# Patient Record
Sex: Male | Born: 1995 | Race: Black or African American | Hispanic: No | Marital: Single | Smoking: Current every day smoker
Health system: Southern US, Community
[De-identification: ages and names within clinical notes are randomized; demographics above are authoritative.]

## PROBLEM LIST (undated history)

## (undated) DIAGNOSIS — R011 Cardiac murmur, unspecified: Secondary | ICD-10-CM

## (undated) HISTORY — PX: CARDIAC SURGERY: SHX584

---

## 2016-07-19 ENCOUNTER — Emergency Department (HOSPITAL_COMMUNITY)
Admission: EM | Admit: 2016-07-19 | Discharge: 2016-07-19 | Disposition: A | Discharge status: Home / Self Care | Payer: Self-pay | Attending: Emergency Medicine | Admitting: Emergency Medicine

## 2016-07-19 ENCOUNTER — Encounter (HOSPITAL_COMMUNITY): Payer: Self-pay

## 2016-07-19 ENCOUNTER — Emergency Department (HOSPITAL_COMMUNITY): Payer: Self-pay

## 2016-07-19 DIAGNOSIS — R109 Unspecified abdominal pain: Secondary | ICD-10-CM

## 2016-07-19 DIAGNOSIS — E860 Dehydration: Secondary | ICD-10-CM

## 2016-07-19 DIAGNOSIS — K529 Noninfective gastroenteritis and colitis, unspecified: Secondary | ICD-10-CM | POA: Insufficient documentation

## 2016-07-19 DIAGNOSIS — R112 Nausea with vomiting, unspecified: Secondary | ICD-10-CM

## 2016-07-19 HISTORY — DX: Cardiac murmur, unspecified: R01.1

## 2016-07-19 LAB — CBC
HEMATOCRIT: 44.7 % (ref 39.0–52.0)
Hemoglobin: 15.4 g/dL (ref 13.0–17.0)
MCH: 32.7 pg (ref 26.0–34.0)
MCHC: 34.5 g/dL (ref 30.0–36.0)
MCV: 94.9 fL (ref 78.0–100.0)
PLATELETS: 305 10*3/uL (ref 150–400)
RBC: 4.71 MIL/uL (ref 4.22–5.81)
RDW: 13 % (ref 11.5–15.5)
WBC: 10.3 10*3/uL (ref 4.0–10.5)

## 2016-07-19 LAB — COMPREHENSIVE METABOLIC PANEL
ALT: 40 U/L (ref 17–63)
AST: 40 U/L (ref 15–41)
Albumin: 4.9 g/dL (ref 3.5–5.0)
Alkaline Phosphatase: 71 U/L (ref 38–126)
Anion gap: 10 (ref 5–15)
BUN: 18 mg/dL (ref 6–20)
CHLORIDE: 105 mmol/L (ref 101–111)
CO2: 23 mmol/L (ref 22–32)
CREATININE: 0.88 mg/dL (ref 0.61–1.24)
Calcium: 9.8 mg/dL (ref 8.9–10.3)
GFR calc Af Amer: >60 mL/min (ref >60)
Glucose, Bld: 77 mg/dL (ref 65–99)
Potassium: 3.8 mmol/L (ref 3.5–5.1)
SODIUM: 138 mmol/L (ref 135–145)
Total Bilirubin: 1.7 mg/dL — ABNORMAL HIGH (ref 0.3–1.2)
Total Protein: 8.5 g/dL — ABNORMAL HIGH (ref 6.5–8.1)

## 2016-07-19 LAB — URINE MICROSCOPIC-ADD ON

## 2016-07-19 LAB — URINALYSIS, ROUTINE W REFLEX MICROSCOPIC
GLUCOSE, UA: NEGATIVE mg/dL
Ketones, ur: >80 mg/dL — AB
LEUKOCYTES UA: NEGATIVE
Nitrite: NEGATIVE
PH: 6 (ref 5.0–8.0)
Protein, ur: 30 mg/dL — AB
Specific Gravity, Urine: 1.03 (ref 1.005–1.030)

## 2016-07-19 LAB — LIPASE, BLOOD

## 2016-07-19 MED ORDER — ONDANSETRON HCL 4 MG/2ML IJ SOLN
4.0000 mg | Freq: Once | INTRAMUSCULAR | Status: AC
  Administered 2016-07-19: 4 mg via INTRAVENOUS
  Filled 2016-07-19: qty 2

## 2016-07-19 MED ORDER — SODIUM CHLORIDE 0.9 % IV BOLUS (SEPSIS)
2000.0000 mL | Freq: Once | INTRAVENOUS | Status: AC
  Administered 2016-07-19: 2000 mL via INTRAVENOUS

## 2016-07-19 MED ORDER — PROMETHAZINE HCL 25 MG PO TABS: 25.0000 mg | ORAL_TABLET | Freq: Four times a day (QID) | ORAL | 0 refills | Status: DC | PRN

## 2016-07-19 NOTE — ED Notes (Signed)
Requested urine sample,patient states unable to provide one at this time.

## 2016-07-19 NOTE — ED Notes (Signed)
Patient states to weak for blood draw at this time.

## 2016-07-19 NOTE — ED Notes (Signed)
Pt educated that if he wants to leave prior to being seen that he needs to inform staff, so we can remove his IV.  Pt verbalized understanding.

## 2016-07-19 NOTE — ED Notes (Signed)
Patient denies nausea, given OJ per his request for PO challenge.

## 2016-07-19 NOTE — ED Provider Notes (Signed)
[facility]-EMERGENCY DEPT Provider Note   CSN: [id number] Arrival date & time: 07/19/16  1429  First Provider Contact:  First MD Initiated Contact with Patient 07/19/16 1940        History   Chief Complaint Chief Complaint  Patient presents with  . Abdominal Pain  . Emesis    HPI Wayne Short is a 20 y.o. male.  HPI Patient presents to the emergency department with abdominal pain with nausea and vomiting.  The patient states that he was out drinking last night, woke up this morning with nausea, vomiting and some abdominal discomfort.  Patient states that he having more flank pain than anything else.  Patient states that nothing seems make the condition better or worse.  Patient denies taking any medications prior to arrival.  The patient denies chest pain, shortness of breath, headache,blurred vision, neck pain, fever, cough, weakness, numbness, dizziness, anorexia, edema,  diarrhea, rash, back pain, dysuria, hematemesis, bloody stool, near syncope, or syncope. Past Medical History:  Diagnosis Date  . Heart murmur     There are no active problems to display for this patient.   Past Surgical History:  Procedure Laterality Date  . CARDIAC SURGERY         Home Medications    Prior to Admission medications   Not on File    Family History History reviewed. No pertinent family history.  Social History Social History  Substance Use Topics  . Smoking status: Never Smoker  . Smokeless tobacco: Never Used  . Alcohol use Yes     Allergies   Review of patient's allergies indicates no known allergies.   Review of Systems Review of Systems All other systems negative except as documented in the HPI. All pertinent positives and negatives as reviewed in the HPI. Physical Exam Updated Vital Signs BP 139/75 (BP Location: Left Arm)   Pulse 84   Temp 99 F (37.2 C) (Oral)   Resp 20   SpO2 98%   Physical Exam  Constitutional: He is oriented to person, place, and time.  He appears well-developed and well-nourished. No distress.  HENT:  Head: Normocephalic and atraumatic.  Mouth/Throat: Oropharynx is clear and moist.  Eyes: Pupils are equal, round, and reactive to light.  Neck: Normal range of motion. Neck supple.  Cardiovascular: Normal rate, regular rhythm and normal heart sounds.  Exam reveals no gallop and no friction rub.   No murmur heard. Pulmonary/Chest: Effort normal and breath sounds normal. No respiratory distress. He has no wheezes.  Abdominal: Soft. Bowel sounds are normal. He exhibits no distension and no mass. There is tenderness. There is no guarding.  Neurological: He is alert and oriented to person, place, and time. He exhibits normal muscle tone. Coordination normal.  Skin: Skin is warm and dry. No rash noted. No erythema.  Psychiatric: He has a normal mood and affect. His behavior is normal.  Nursing note and vitals reviewed.    ED Treatments / Results  Labs (all labs ordered are listed, but only abnormal results are displayed) Labs Reviewed  CBC  LIPASE, BLOOD  COMPREHENSIVE METABOLIC PANEL  URINALYSIS, ROUTINE W REFLEX MICROSCOPIC (NOT AT [facility])    EKG  EKG Interpretation None       Radiology No results found.  Procedures Procedures (including critical care time)  Medications Ordered in ED Medications - No data to display   Initial Impression / Assessment and Plan / ED Course  I have reviewed the triage vital signs and the nursing notes.  Pertinent labs & imaging results that were available during my care of the patient were reviewed by me and considered in my medical decision making (see chart for details).  Clinical Course    Patient retreated for alcohol and his nausea and vomiting.  Told to return here as needed.  CT scan was performed due to the fact that he is having significant flank pain on exam.  Patient is advised return here as needed.  He agrees the plan and all questions were answered.  The patient  has tolerated oral fluids  Final Clinical Impressions(s) / ED Diagnoses   Final diagnoses:  Flank pain    New Prescriptions New Prescriptions   No medications on file     Charlestine Night, PA-C 07/22/16 1518    Pricilla Loveless, MD 07/23/16 [phone]

## 2016-07-19 NOTE — ED Notes (Signed)
Patient c/o sudden onset emesis that started at 0800. Patient states he vomited so many times he couldn't count. Patient c/o left lateral abd pain, states this started after he was vomiting. Patient rates this pain at 1/10 now. Patient only "complaint" at this time is his throat is sore. Patient given water in effort to obtain UA, denies emesis. Patient with hypoactive bowel sounds x4 at this time. Patient is A&Ox4, NAD noted.   Patient is aware we are still in need of a urine sample. Second cup of water given by Molly Maduro, EMT.

## 2016-07-19 NOTE — ED Triage Notes (Signed)
Per EMS, Pt, from home, c/o L side abdominal pain and n/v starting this morning.  Pain score 10/10.  12.5G dextrose and 4mg  Zofran given en route.

## 2016-07-19 NOTE — Discharge Instructions (Signed)
Return here as needed. Slowly increase your fluid intake.  °

## 2016-09-14 ENCOUNTER — Encounter (HOSPITAL_COMMUNITY): Payer: Self-pay | Admitting: Vascular Surgery

## 2016-09-14 ENCOUNTER — Emergency Department (HOSPITAL_COMMUNITY)
Admission: EM | Admit: 2016-09-14 | Discharge: 2016-09-14 | Disposition: A | Discharge status: Home / Self Care | Payer: Self-pay | Attending: Emergency Medicine | Admitting: Emergency Medicine

## 2016-09-14 DIAGNOSIS — R112 Nausea with vomiting, unspecified: Secondary | ICD-10-CM | POA: Insufficient documentation

## 2016-09-14 DIAGNOSIS — Z5321 Procedure and treatment not carried out due to patient leaving prior to being seen by health care provider: Secondary | ICD-10-CM | POA: Insufficient documentation

## 2016-09-14 LAB — TROPONIN I

## 2016-09-14 LAB — COMPREHENSIVE METABOLIC PANEL
ALT: 25 U/L (ref 17–63)
AST: 34 U/L (ref 15–41)
Albumin: 4.7 g/dL (ref 3.5–5.0)
Alkaline Phosphatase: 70 U/L (ref 38–126)
Anion gap: 15 (ref 5–15)
BILIRUBIN TOTAL: 1.3 mg/dL — AB (ref 0.3–1.2)
BUN: 15 mg/dL (ref 6–20)
CO2: 19 mmol/L — ABNORMAL LOW (ref 22–32)
Calcium: 10.2 mg/dL (ref 8.9–10.3)
Chloride: 105 mmol/L (ref 101–111)
Creatinine, Ser: 1.02 mg/dL (ref 0.61–1.24)
Glucose, Bld: 92 mg/dL (ref 65–99)
POTASSIUM: 3.6 mmol/L (ref 3.5–5.1)
Sodium: 139 mmol/L (ref 135–145)
TOTAL PROTEIN: 8.8 g/dL — AB (ref 6.5–8.1)

## 2016-09-14 LAB — CBC
HCT: 43.1 % (ref 39.0–52.0)
Hemoglobin: 14.9 g/dL (ref 13.0–17.0)
MCH: 31.6 pg (ref 26.0–34.0)
MCHC: 34.6 g/dL (ref 30.0–36.0)
MCV: 91.5 fL (ref 78.0–100.0)
Platelets: 272 10*3/uL (ref 150–400)
RBC: 4.71 MIL/uL (ref 4.22–5.81)
RDW: 12.5 % (ref 11.5–15.5)
WBC: 8.2 10*3/uL (ref 4.0–10.5)

## 2016-09-14 MED ORDER — ONDANSETRON 4 MG PO TBDP
4.0000 mg | ORAL_TABLET | Freq: Once | ORAL | Status: AC
  Administered 2016-09-14: 4 mg via ORAL

## 2016-09-14 MED ORDER — ONDANSETRON 4 MG PO TBDP
ORAL_TABLET | ORAL | Status: AC
  Filled 2016-09-14: qty 1

## 2016-09-14 NOTE — ED Notes (Signed)
Pt sitting up -- no further vomiting.

## 2016-09-14 NOTE — ED Notes (Signed)
Patient left without being seen.

## 2016-09-14 NOTE — ED Triage Notes (Signed)
Pt reports to the ED for eval of N/V. Pt reports he drank ETOH yesterday and he smoked some weed as well and this am when he woke up he has been having N/V. Pt denies any symptoms other than the N/V. Pt requires repeated verbal stimuli to stay awake. Pt oriented x 4. [facility]. Pt reports some black emesis and some BRB streaking in the emesis. Pt also reports some abd pain and HA.

## 2017-01-16 ENCOUNTER — Encounter (HOSPITAL_COMMUNITY): Payer: Self-pay | Admitting: Emergency Medicine

## 2017-01-16 ENCOUNTER — Emergency Department (HOSPITAL_COMMUNITY)
Admission: EM | Admit: 2017-01-16 | Discharge: 2017-01-16 | Disposition: A | Discharge status: Home / Self Care | Payer: 59 | Attending: Emergency Medicine | Admitting: Emergency Medicine

## 2017-01-16 DIAGNOSIS — K920 Hematemesis: Secondary | ICD-10-CM | POA: Insufficient documentation

## 2017-01-16 LAB — COMPREHENSIVE METABOLIC PANEL
ALBUMIN: 4.4 g/dL (ref 3.5–5.0)
ALT: 27 U/L (ref 17–63)
AST: 28 U/L (ref 15–41)
Alkaline Phosphatase: 64 U/L (ref 38–126)
Anion gap: 11 (ref 5–15)
BUN: 11 mg/dL (ref 6–20)
CALCIUM: 9.5 mg/dL (ref 8.9–10.3)
CO2: 23 mmol/L (ref 22–32)
CREATININE: 0.75 mg/dL (ref 0.61–1.24)
Chloride: 102 mmol/L (ref 101–111)
GFR calc Af Amer: >60 mL/min (ref >60)
GFR calc non Af Amer: >60 mL/min (ref >60)
GLUCOSE: 70 mg/dL (ref 65–99)
Potassium: 3.6 mmol/L (ref 3.5–5.1)
SODIUM: 136 mmol/L (ref 135–145)
Total Bilirubin: 1.1 mg/dL (ref 0.3–1.2)
Total Protein: 8 g/dL (ref 6.5–8.1)

## 2017-01-16 LAB — LIPASE, BLOOD: Lipase: 11 U/L (ref 11–51)

## 2017-01-16 LAB — CBC WITH DIFFERENTIAL/PLATELET
BASOS ABS: 0 10*3/uL (ref 0.0–0.1)
BASOS PCT: 0 %
Eosinophils Absolute: 0.2 10*3/uL (ref 0.0–0.7)
Eosinophils Relative: 4 %
HEMATOCRIT: 41.1 % (ref 39.0–52.0)
HEMOGLOBIN: 14.1 g/dL (ref 13.0–17.0)
LYMPHS PCT: 31 %
Lymphs Abs: 2.1 10*3/uL (ref 0.7–4.0)
MCH: 31.6 pg (ref 26.0–34.0)
MCHC: 34.3 g/dL (ref 30.0–36.0)
MCV: 92.2 fL (ref 78.0–100.0)
Monocytes Absolute: 0.4 10*3/uL (ref 0.1–1.0)
Monocytes Relative: 6 %
NEUTROS PCT: 59 %
Neutro Abs: 4 10*3/uL (ref 1.7–7.7)
Platelets: 291 10*3/uL (ref 150–400)
RBC: 4.46 MIL/uL (ref 4.22–5.81)
RDW: 13.2 % (ref 11.5–15.5)
WBC: 6.7 10*3/uL (ref 4.0–10.5)

## 2017-01-16 MED ORDER — SODIUM CHLORIDE 0.9 % IV BOLUS (SEPSIS)
1000.0000 mL | Freq: Once | INTRAVENOUS | Status: AC
  Administered 2017-01-16: 1000 mL via INTRAVENOUS

## 2017-01-16 MED ORDER — ONDANSETRON HCL 4 MG/2ML IJ SOLN
4.0000 mg | Freq: Once | INTRAMUSCULAR | Status: AC
  Administered 2017-01-16: 4 mg via INTRAVENOUS
  Filled 2017-01-16: qty 2

## 2017-01-16 NOTE — Discharge Instructions (Signed)
If you were given medicines take as directed.  If you are on coumadin or contraceptives realize their levels and effectiveness is altered by many different medicines.  If you have any reaction (rash, tongues swelling, other) to the medicines stop taking and see a physician.    If your blood pressure was elevated in the ER make sure you follow up for management with a primary doctor or return for chest pain, shortness of breath or stroke symptoms.  Please follow up as directed and return to the ER or see a physician for new or worsening symptoms.  Thank you. Vitals:   01/16/17 1425 01/16/17 1715 01/16/17 1745 01/16/17 1845  BP:  122/62 120/73 123/75  Pulse:  69 66 80  Resp:  16    Temp:  98.8 F (37.1 C)    TempSrc:  Oral    SpO2:  95% 99% 100%  Weight: 190 lb (86.2 kg)     Height: 6\' 4"  (1.93 m)

## 2017-01-16 NOTE — ED Notes (Signed)
No answer x2 

## 2017-01-16 NOTE — ED Triage Notes (Signed)
Pt. Stated, I was spitting up blood for 2 days . I also had some watery type stuff coming up.

## 2017-01-19 ENCOUNTER — Encounter: Payer: Self-pay | Admitting: Gastroenterology

## 2017-01-24 NOTE — ED Provider Notes (Signed)
[facility]-EMERGENCY DEPT Provider Note   CSN: [id number] Arrival date & time: 01/16/17  1330     History   Chief Complaint Chief Complaint  Patient presents with  . Emesis    spitting up blood  . Abdominal Pain    HPI Wayne Short is a 21 y.o. male.  Pt with hematemesis for two days.  No hx of ulcers, no excessive nsaid use or ulcer hx.  Mild diarrhea.  No fevers. MIld intermittent epig pain.  No GI hx.  Minimal alcohol use.       Past Medical History:  Diagnosis Date  . Heart murmur     There are no active problems to display for this patient.   Past Surgical History:  Procedure Laterality Date  . CARDIAC SURGERY         Home Medications    Prior to Admission medications   Medication Sig Start Date End Date Taking? Authorizing Provider  promethazine (PHENERGAN) 25 MG tablet Take 1 tablet (25 mg total) by mouth every 6 (six) hours as needed for nausea or vomiting. 07/19/16   Charlestine Night, PA-C    Family History No family history on file.  Social History Social History  Substance Use Topics  . Smoking status: Never Smoker  . Smokeless tobacco: Never Used  . Alcohol use Yes     Allergies   Patient has no known allergies.   Review of Systems Review of Systems  Constitutional: Negative for chills and fever.  HENT: Negative for congestion.   Eyes: Negative for visual disturbance.  Respiratory: Negative for shortness of breath.   Cardiovascular: Negative for chest pain.  Gastrointestinal: Positive for abdominal pain, diarrhea, nausea and vomiting.  Genitourinary: Negative for dysuria and flank pain.  Musculoskeletal: Negative for back pain, neck pain and neck stiffness.  Skin: Negative for rash.  Neurological: Negative for light-headedness and headaches.     Physical Exam Updated Vital Signs BP 118/68   Pulse 77   Temp 98.8 F (37.1 C) (Oral)   Resp 16   Ht 6\' 4"  (1.93 m)   Wt 190 lb (86.2 kg)   SpO2 99%   BMI 23.13 kg/m    Physical Exam  Constitutional: He is oriented to person, place, and time. He appears well-developed and well-nourished.  HENT:  Head: Normocephalic and atraumatic.  Eyes: Conjunctivae are normal. Right eye exhibits no discharge. Left eye exhibits no discharge.  Neck: Normal range of motion. Neck supple. No tracheal deviation present.  Cardiovascular: Normal rate and regular rhythm.   Pulmonary/Chest: Effort normal and breath sounds normal.  Abdominal: Soft. He exhibits no distension. There is tenderness (mild epig tender). There is no guarding.  Musculoskeletal: He exhibits no edema.  Neurological: He is alert and oriented to person, place, and time.  Skin: Skin is warm. No rash noted.  Psychiatric: He has a normal mood and affect.  Nursing note and vitals reviewed.    ED Treatments / Results  Labs (all labs ordered are listed, but only abnormal results are displayed) Labs Reviewed  CBC WITH DIFFERENTIAL/PLATELET  LIPASE, BLOOD  COMPREHENSIVE METABOLIC PANEL    EKG  EKG Interpretation None       Radiology No results found.  Procedures Procedures (including critical care time)  Medications Ordered in ED Medications  ondansetron (ZOFRAN) injection 4 mg (4 mg Intravenous Given 01/16/17 1815)  sodium chloride 0.9 % bolus 1,000 mL (0 mLs Intravenous Stopped 01/16/17 2028)     Initial Impression / Assessment and Plan /  ED Course  I have reviewed the triage vital signs and the nursing notes.  Pertinent labs & imaging results that were available during my care of the patient were reviewed by me and considered in my medical decision making (see chart for details).    WEll appearing male presents after mild hematemesis.  NO episodes in ED>  Blood work reassuring. Discussed fup with GI.    Results and differential diagnosis were discussed with the patient/parent/guardian. Xrays were independently reviewed by myself.  Close follow up outpatient was discussed,  comfortable with the plan.   Medications  ondansetron [facility](ZOFRAN) injection 4 mg (4 mg Intravenous Given 01/16/17 1815)  sodium chloride 0.9 % bolus 1,000 mL (0 mLs Intravenous Stopped 01/16/17 2028)    Vitals:   01/16/17 1845 01/16/17 1900 01/16/17 1930 01/16/17 2000  BP: 123/75 131/80 121/69 118/68  Pulse: 80 85 84 77  Resp:      Temp:      TempSrc:      SpO2: 100% 100% 99% 99%  Weight:      Height:        Final diagnoses:  Hematemesis with nausea     Final Clinical Impressions(s) / ED Diagnoses   Final diagnoses:  Hematemesis with nausea    New Prescriptions Discharge Medication List as of 01/16/2017  8:26 PM       Blane OharaJoshua Allecia Bells, MD 01/24/17 1622

## 2017-02-23 ENCOUNTER — Ambulatory Visit: Payer: 59 | Admitting: Gastroenterology

## 2017-07-11 ENCOUNTER — Emergency Department
Admission: EM | Admit: 2017-07-11 | Discharge: 2017-07-11 | Disposition: A | Discharge status: Home / Self Care | Payer: 59 | Attending: Student in an Organized Health Care Education/Training Program | Admitting: Student in an Organized Health Care Education/Training Program

## 2017-07-11 ENCOUNTER — Encounter: Payer: Self-pay | Admitting: Emergency Medicine

## 2017-07-11 ENCOUNTER — Emergency Department: Payer: 59

## 2017-07-11 DIAGNOSIS — R1013 Epigastric pain: Secondary | ICD-10-CM | POA: Insufficient documentation

## 2017-07-11 DIAGNOSIS — K920 Hematemesis: Secondary | ICD-10-CM | POA: Insufficient documentation

## 2017-07-11 LAB — COMPREHENSIVE METABOLIC PANEL
ALBUMIN: 4.4 g/dL (ref 3.5–5.0)
ALK PHOS: 75 U/L (ref 38–126)
ALT: 16 U/L — ABNORMAL LOW (ref 17–63)
ANION GAP: 7 (ref 5–15)
AST: 22 U/L (ref 15–41)
BILIRUBIN TOTAL: 0.6 mg/dL (ref 0.3–1.2)
BUN: 9 mg/dL (ref 6–20)
CALCIUM: 9.6 mg/dL (ref 8.9–10.3)
CO2: 27 mmol/L (ref 22–32)
Chloride: 109 mmol/L (ref 101–111)
Creatinine, Ser: 0.65 mg/dL (ref 0.61–1.24)
GLUCOSE: 84 mg/dL (ref 65–99)
POTASSIUM: 3.6 mmol/L (ref 3.5–5.1)
Sodium: 143 mmol/L (ref 135–145)
Total Protein: 8.2 g/dL — ABNORMAL HIGH (ref 6.5–8.1)

## 2017-07-11 LAB — CBC
HEMATOCRIT: 39.8 % — AB (ref 40.0–52.0)
HEMOGLOBIN: 13.8 g/dL (ref 13.0–18.0)
MCH: 32.1 pg (ref 26.0–34.0)
MCHC: 34.7 g/dL (ref 32.0–36.0)
MCV: 92.4 fL (ref 80.0–100.0)
Platelets: 266 10*3/uL (ref 150–440)
RBC: 4.31 MIL/uL — AB (ref 4.40–5.90)
RDW: 13.5 % (ref 11.5–14.5)
WBC: 5.9 10*3/uL (ref 3.8–10.6)

## 2017-07-11 LAB — TYPE AND SCREEN
ABO/RH(D): O NEG
ANTIBODY SCREEN: NEGATIVE

## 2017-07-11 MED ORDER — RANITIDINE HCL 150 MG PO TABS: 150.0000 mg | ORAL_TABLET | Freq: Two times a day (BID) | ORAL | 1 refills | Status: DC

## 2017-07-11 MED ORDER — SUCRALFATE 1 G PO TABS
1.0000 g | ORAL_TABLET | Freq: Once | ORAL | Status: AC
  Administered 2017-07-11: 1 g via ORAL
  Filled 2017-07-11: qty 1

## 2017-07-11 MED ORDER — GI COCKTAIL ~~LOC~~
30.0000 mL | Freq: Once | ORAL | Status: AC
  Administered 2017-07-11: 30 mL via ORAL
  Filled 2017-07-11: qty 30

## 2017-07-11 MED ORDER — ONDANSETRON 4 MG PO TBDP
4.0000 mg | ORAL_TABLET | Freq: Once | ORAL | Status: AC
  Administered 2017-07-11: 4 mg via ORAL

## 2017-07-11 MED ORDER — ONDANSETRON 4 MG PO TBDP: 4.0000 mg | ORAL_TABLET | Freq: Three times a day (TID) | ORAL | 0 refills | Status: DC | PRN

## 2017-07-11 MED ORDER — ONDANSETRON 4 MG PO TBDP
ORAL_TABLET | ORAL | Status: AC
  Filled 2017-07-11: qty 1

## 2017-07-11 NOTE — ED Provider Notes (Signed)
[facility] Emergency Department Provider Note    First MD Initiated Contact with Patient 07/11/17 1645     (approximate)  I have reviewed the triage vital signs and the nursing notes.   HISTORY  Chief Complaint Hematemesis    HPI Erhardt Dada is a 21 y.o. male with a history of gastritis presents with chief complaint of nausea vomiting with blood tinged emesis this morning. Patient is not on any blood thinners. States he's been told that he has gastric ulcers in the past. Denies any retching or forceful vomiting. Denies any chest pain. No fevers or shortness of breath. Has not tried anything for nausea or for gastritis in the past. Was never given her referral to see GI. No family history of Crohn's or inflammatory bowel disease.  Denies any diarrhea. No melena.   Past Medical History:  Diagnosis Date  . Heart murmur    History reviewed. No pertinent family history. Past Surgical History:  Procedure Laterality Date  . CARDIAC SURGERY     There are no active problems to display for this patient.     Prior to Admission medications   Medication Sig Start Date End Date Taking? Authorizing Provider  ondansetron (ZOFRAN ODT) 4 MG disintegrating tablet Take 1 tablet (4 mg total) by mouth every 8 (eight) hours as needed for nausea or vomiting. 07/11/17   Willy Eddy, MD  promethazine (PHENERGAN) 25 MG tablet Take 1 tablet (25 mg total) by mouth every 6 (six) hours as needed for nausea or vomiting. 07/19/16   Lawyer, Cristal Deer, PA-C  ranitidine (ZANTAC) 150 MG tablet Take 1 tablet (150 mg total) by mouth 2 (two) times daily. 07/11/17 07/11/18  Willy Eddy, MD    Allergies Patient has no known allergies.    Social History Social History  Substance Use Topics  . Smoking status: Never Smoker  . Smokeless tobacco: Never Used  . Alcohol use Yes    Review of Systems Patient denies headaches, rhinorrhea, blurry vision, numbness, shortness  of breath, chest pain, edema, cough, abdominal pain, nausea, vomiting, diarrhea, dysuria, fevers, rashes or hallucinations unless otherwise stated above in HPI. ____________________________________________   PHYSICAL EXAM:  VITAL SIGNS: Vitals:   07/11/17 1449 07/11/17 1725  BP: 128/78 122/72  Pulse: 73 76  Resp: 18 16  Temp: 98 F (36.7 C)     Constitutional: Alert and oriented. Well appearing and in no acute distress. Eyes: Conjunctivae are normal.  Head: Atraumatic. Nose: No congestion/rhinnorhea. Mouth/Throat: Mucous membranes are moist.   Neck: No stridor. Painless ROM.  Cardiovascular: Normal rate, regular rhythm. Grossly normal heart sounds.  Good peripheral circulation. Respiratory: Normal respiratory effort.  No retractions. Lungs CTAB. Gastrointestinal: Soft and nontender. No distention. No abdominal bruits. No CVA tenderness. Musculoskeletal: No lower extremity tenderness nor edema.  No joint effusions. Neurologic:  Normal speech and language. No gross focal neurologic deficits are appreciated. No facial droop Skin:  Skin is warm, dry and intact. No rash noted. Psychiatric: Mood and affect are normal. Speech and behavior are normal.  ____________________________________________   LABS (all labs ordered are listed, but only abnormal results are displayed)  Results for orders placed or performed during the hospital encounter of 07/11/17 (from the past 24 hour(s))  Comprehensive metabolic panel     Status: Abnormal   Collection Time: 07/11/17  2:53 PM  Result Value Ref Range   Sodium 143 135 - 145 mmol/L   Potassium 3.6 3.5 - 5.1 mmol/L   Chloride 109 101 -  111 mmol/L   CO2 27 22 - 32 mmol/L   Glucose, Bld 84 65 - 99 mg/dL   BUN 9 6 - 20 mg/dL   Creatinine, Ser 1.61 0.61 - 1.24 mg/dL   Calcium 9.6 8.9 - 09.6 mg/dL   Total Protein 8.2 (H) 6.5 - 8.1 g/dL   Albumin 4.4 3.5 - 5.0 g/dL   AST 22 15 - 41 U/L   ALT 16 (L) 17 - 63 U/L   Alkaline Phosphatase 75 38 -  126 U/L   Total Bilirubin 0.6 0.3 - 1.2 mg/dL   GFR calc non Af Amer >60 >60 mL/min   GFR calc Af Amer >60 >60 mL/min   Anion gap 7 5 - 15  CBC     Status: Abnormal   Collection Time: 07/11/17  2:53 PM  Result Value Ref Range   WBC 5.9 3.8 - 10.6 K/uL   RBC 4.31 (L) 4.40 - 5.90 MIL/uL   Hemoglobin 13.8 13.0 - 18.0 g/dL   HCT 04.5 (L) 40.9 - 81.1 %   MCV 92.4 80.0 - 100.0 fL   MCH 32.1 26.0 - 34.0 pg   MCHC 34.7 32.0 - 36.0 g/dL   RDW 91.4 78.2 - 95.6 %   Platelets 266 150 - 440 K/uL  Type and screen [facility]     Status: None   Collection Time: 07/11/17  2:53 PM  Result Value Ref Range   ABO/RH(D) O NEG    Antibody Screen NEG    Sample Expiration 07/14/2017    ____________________________________________ ___________________________________________  RADIOLOGY  I personally reviewed all radiographic images ordered to evaluate for the above acute complaints and reviewed radiology reports and findings.  These findings were personally discussed with the patient.  Please see medical record for radiology report.  ____________________________________________   PROCEDURES  Procedure(s) performed:  Procedures    Critical Care performed: no ____________________________________________   INITIAL IMPRESSION / ASSESSMENT AND PLAN / ED COURSE  Pertinent labs & imaging results that were available during my care of the patient were reviewed by me and considered in my medical decision making (see chart for details).  DDX: gastritis, pud, esophagitis, boerhaaves, enteritis, perf, sbo  Viraj Territo is a 21 y.o. who presents to the ED with epigastric pain and hematemesis.  Patient is AFVSS in ED. Exam as above. Given current presentation have considered the above differential. Abdominal Exam is soft and benign. Blood work is reassuring. Not clinically consistent with mediastinitis or Boerhaave's. Patient's symptoms improved after GI cocktail. Do suspect some  kind of gastritis. Patient will be discharged on Rxfor Zantac with referral to outpatient GI.  Have discussed with the patient and available family all diagnostics and treatments performed thus far and all questions were answered to the best of my ability. The patient demonstrates understanding and agreement with plan.       ____________________________________________   FINAL CLINICAL IMPRESSION(S) / ED DIAGNOSES  Final diagnoses:  Hematemesis with nausea  Epigastric pain      NEW MEDICATIONS STARTED DURING THIS VISIT:  Discharge Medication List as of 07/11/2017  5:21 PM    START taking these medications   Details  ondansetron (ZOFRAN ODT) 4 MG disintegrating tablet Take 1 tablet (4 mg total) by mouth every 8 (eight) hours as needed for nausea or vomiting., Starting Sun 07/11/2017, Print    ranitidine (ZANTAC) 150 MG tablet Take 1 tablet (150 mg total) by mouth 2 (two) times daily., Starting Sun 07/11/2017, Until Mon 07/11/2018,  Print         Note:  This document was prepared using Dragon voice recognition software and may include unintentional dictation errors.    Willy Eddyobinson, Zoelle Markus, MD 07/11/17 1944

## 2017-07-11 NOTE — ED Triage Notes (Signed)
Pt arrived via POV from home with reports of vomiting blood since this morning, pt states he is vomiting up coffee ground, black emesis. States he has been told in the past he may have a possible ulcer. No medications. Denies any etoh or drug use.

## 2017-07-11 NOTE — Discharge Instructions (Signed)

## 2017-09-28 ENCOUNTER — Encounter (HOSPITAL_COMMUNITY): Payer: Self-pay | Admitting: Emergency Medicine

## 2017-09-28 ENCOUNTER — Emergency Department (HOSPITAL_COMMUNITY)
Admission: EM | Admit: 2017-09-28 | Discharge: 2017-09-28 | Disposition: A | Discharge status: Home / Self Care | Payer: 59 | Attending: Emergency Medicine | Admitting: Emergency Medicine

## 2017-09-28 DIAGNOSIS — M545 Low back pain, unspecified: Secondary | ICD-10-CM

## 2017-09-28 DIAGNOSIS — R011 Cardiac murmur, unspecified: Secondary | ICD-10-CM | POA: Diagnosis not present

## 2017-09-28 DIAGNOSIS — Y998 Other external cause status: Secondary | ICD-10-CM | POA: Insufficient documentation

## 2017-09-28 DIAGNOSIS — Y939 Activity, unspecified: Secondary | ICD-10-CM | POA: Diagnosis not present

## 2017-09-28 DIAGNOSIS — Y92410 Unspecified street and highway as the place of occurrence of the external cause: Secondary | ICD-10-CM | POA: Diagnosis not present

## 2017-09-28 MED ORDER — CYCLOBENZAPRINE HCL 10 MG PO TABS: 10.0000 mg | ORAL_TABLET | Freq: Two times a day (BID) | ORAL | 0 refills | Status: DC | PRN

## 2017-09-28 MED ORDER — IBUPROFEN 800 MG PO TABS
800.0000 mg | ORAL_TABLET | Freq: Once | ORAL | Status: AC
  Administered 2017-09-28: 800 mg via ORAL
  Filled 2017-09-28: qty 1
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
## 2017-09-28 NOTE — Discharge Instructions (Signed)
Your exam today was normal, no signs of serious head, chest or abdomen injury. Expect to have neck and back pain/soreness for the next week or two.   Back Pain:  Your back pain should be treated with medicines such as ibuprofen or aleve and this back pain should get better over the next 2 weeks.  However if you develop severe or worsening pain, low back pain with fever, numbness, weakness or inability to walk or urinate, you should return to the ER immediately.  Self - care:  The application of heat can help soothe the pain.  Maintaining your daily activities, including walking, is encourged, as it will help you get better faster than just staying in bed.  Non steroidal anti inflammatory medications including Ibuprofen and naproxen;  These medications help both pain and swelling and are very useful in treating back pain.  They should be taken with food, as they can cause stomach upset, and more seriously, stomach bleeding.    Muscle relaxants:  These medications can help with muscle tightness that is a cause of lower back pain.  Most of these medications can cause drowsiness, and it is not safe to drive or use dangerous machinery while taking them.  You will need to follow up with your primary healthcare provider in 1-2 weeks for reassessment.
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
## 2018-02-10 ENCOUNTER — Other Ambulatory Visit: Payer: Self-pay

## 2018-02-10 ENCOUNTER — Encounter (HOSPITAL_COMMUNITY): Payer: Self-pay | Admitting: Emergency Medicine

## 2018-02-10 ENCOUNTER — Emergency Department (HOSPITAL_COMMUNITY)
Admission: EM | Admit: 2018-02-10 | Discharge: 2018-02-10 | Disposition: A | Discharge status: Home / Self Care | Payer: 59 | Attending: Emergency Medicine | Admitting: Emergency Medicine

## 2018-02-10 ENCOUNTER — Emergency Department (HOSPITAL_COMMUNITY): Payer: 59

## 2018-02-10 DIAGNOSIS — R109 Unspecified abdominal pain: Secondary | ICD-10-CM | POA: Diagnosis not present

## 2018-02-10 DIAGNOSIS — R1084 Generalized abdominal pain: Secondary | ICD-10-CM | POA: Diagnosis not present

## 2018-02-10 DIAGNOSIS — K920 Hematemesis: Secondary | ICD-10-CM | POA: Insufficient documentation

## 2018-02-10 DIAGNOSIS — R61 Generalized hyperhidrosis: Secondary | ICD-10-CM | POA: Insufficient documentation

## 2018-02-10 DIAGNOSIS — M791 Myalgia, unspecified site: Secondary | ICD-10-CM | POA: Insufficient documentation

## 2018-02-10 DIAGNOSIS — R05 Cough: Secondary | ICD-10-CM | POA: Insufficient documentation

## 2018-02-10 DIAGNOSIS — R1110 Vomiting, unspecified: Secondary | ICD-10-CM | POA: Diagnosis present

## 2018-02-10 LAB — COMPREHENSIVE METABOLIC PANEL
ALT: 16 U/L — AB (ref 17–63)
AST: 22 U/L (ref 15–41)
Albumin: 4.5 g/dL (ref 3.5–5.0)
Alkaline Phosphatase: 60 U/L (ref 38–126)
Anion gap: 13 (ref 5–15)
BILIRUBIN TOTAL: 0.8 mg/dL (ref 0.3–1.2)
BUN: 12 mg/dL (ref 6–20)
CO2: 24 mmol/L (ref 22–32)
CREATININE: 0.84 mg/dL (ref 0.61–1.24)
Calcium: 9.7 mg/dL (ref 8.9–10.3)
Chloride: 104 mmol/L (ref 101–111)
GFR calc Af Amer: >60 mL/min (ref >60)
Glucose, Bld: 83 mg/dL (ref 65–99)
Potassium: 3.4 mmol/L — ABNORMAL LOW (ref 3.5–5.1)
Sodium: 141 mmol/L (ref 135–145)
TOTAL PROTEIN: 8.5 g/dL — AB (ref 6.5–8.1)

## 2018-02-10 LAB — CBC
HCT: 41.1 % (ref 39.0–52.0)
Hemoglobin: 14.3 g/dL (ref 13.0–17.0)
MCH: 32.4 pg (ref 26.0–34.0)
MCHC: 34.8 g/dL (ref 30.0–36.0)
MCV: 93.2 fL (ref 78.0–100.0)
PLATELETS: 239 10*3/uL (ref 150–400)
RBC: 4.41 MIL/uL (ref 4.22–5.81)
RDW: 12.3 % (ref 11.5–15.5)
WBC: 7.4 10*3/uL (ref 4.0–10.5)

## 2018-02-10 LAB — LIPASE, BLOOD: Lipase: 30 U/L (ref 11–51)

## 2018-02-10 MED ORDER — DICYCLOMINE HCL 20 MG PO TABS: 20.0000 mg | ORAL_TABLET | Freq: Two times a day (BID) | ORAL | 0 refills | Status: DC

## 2018-02-10 MED ORDER — ONDANSETRON 4 MG PO TBDP
4.0000 mg | ORAL_TABLET | Freq: Once | ORAL | Status: AC | PRN
  Administered 2018-02-10: 4 mg via ORAL
  Filled 2018-02-10: qty 1

## 2018-02-10 MED ORDER — SODIUM CHLORIDE 0.9 % IV BOLUS (SEPSIS)
1000.0000 mL | Freq: Once | INTRAVENOUS | Status: AC
  Administered 2018-02-10: 1000 mL via INTRAVENOUS

## 2018-02-10 MED ORDER — ONDANSETRON 4 MG PO TBDP: 4.0000 mg | ORAL_TABLET | Freq: Three times a day (TID) | ORAL | 0 refills | Status: AC | PRN

## 2018-02-10 MED ORDER — POLYETHYLENE GLYCOL 3350 17 GM/SCOOP PO POWD: ORAL | 0 refills | Status: AC

## 2018-02-10 MED ORDER — ONDANSETRON HCL 4 MG/2ML IJ SOLN
4.0000 mg | Freq: Once | INTRAMUSCULAR | Status: AC
  Administered 2018-02-10: 4 mg via INTRAVENOUS
  Filled 2018-02-10: qty 2

## 2018-02-10 MED ORDER — GI COCKTAIL ~~LOC~~
30.0000 mL | Freq: Once | ORAL | Status: AC
  Administered 2018-02-10: 30 mL via ORAL
  Filled 2018-02-10: qty 30

## 2018-02-10 NOTE — Discharge Instructions (Signed)

## 2018-02-10 NOTE — ED Triage Notes (Signed)
Patient states he started vomiting an hour ago bright red blood. Patient states he has vomiting 5 hours ago.

## 2018-02-10 NOTE — ED Notes (Signed)
Requested urine from patient. 

## 2018-02-10 NOTE — ED Provider Notes (Signed)
[facility] Provider Note  CSN: [id number] Arrival date & time: 02/10/18 0221  Chief Complaint(s) Emesis  HPI Wayne Short is a 22 y.o. male   The history is provided by the patient.  Emesis   This is a new problem. The current episode started 6 to 12 hours ago. The problem occurs 2 to 4 times per day. The emesis has an appearance of stomach contents and bright red blood. There has been no fever. Associated symptoms include abdominal pain (cramping, relieved with emesis), cough, myalgias, sweats and URI. Pertinent negatives include no chills, no diarrhea and no fever.   Patient reports that he has been told he had a peptic ulcer but never had an EGD.  States the he is followed by GI and is working on scheduling an EGD.  Past Medical History Past Medical History:  Diagnosis Date  . Heart murmur    There are no active problems to display for this patient.  Home Medication(s) Prior to Admission medications   Medication Sig Start Date End Date Taking? Authorizing Provider  cyclobenzaprine (FLEXERIL) 10 MG tablet Take 1 tablet (10 mg total) by mouth 2 (two) times daily as needed for muscle spasms. Patient not taking: Reported on 02/10/2018 09/28/17   Kellie ShropshireShrosbree, Emily J, PA-C  dicyclomine (BENTYL) 20 MG tablet Take 1 tablet (20 mg total) by mouth 2 (two) times daily. 02/10/18   Nira Connardama, Courtnie Brenes Eduardo, MD  ondansetron (ZOFRAN ODT) 4 MG disintegrating tablet Take 1 tablet (4 mg total) by mouth every 8 (eight) hours as needed for up to 3 days for nausea or vomiting. 02/10/18 02/13/18  Brystol Wasilewski, Amadeo GarnetPedro Eduardo, MD  polyethylene glycol powder (MIRALAX) powder Take 3 capfuls for day 1 and 2. On day 3 start taking 1 capful 3 times a day. Slowly cut back as needed until you have normal bowel movements 02/10/18   Shirell Struthers, Amadeo GarnetPedro Eduardo, MD  promethazine (PHENERGAN) 25 MG tablet Take 1 tablet (25 mg total) by mouth every 6 (six) hours as needed for nausea or  vomiting. Patient not taking: Reported on 02/10/2018 07/19/16   Charlestine NightLawyer, Christopher, PA-C  ranitidine (ZANTAC) 150 MG tablet Take 1 tablet (150 mg total) by mouth 2 (two) times daily. Patient not taking: Reported on 02/10/2018 07/11/17 07/11/18  Willy Eddyobinson, Patrick, MD                                                                                                                                    Past Surgical History Past Surgical History:  Procedure Laterality Date  . CARDIAC SURGERY     Family History History reviewed. No pertinent family history.  Social History Social History   Tobacco Use  . Smoking status: Never Smoker  . Smokeless tobacco: Never Used  Substance Use Topics  . Alcohol use: Yes  . Drug use: Yes    Types: Marijuana   Allergies Patient has no known allergies.  Review of Systems Review of Systems  Constitutional: Negative for chills and fever.  Respiratory: Positive for cough.   Gastrointestinal: Positive for abdominal pain (cramping, relieved with emesis) and vomiting. Negative for diarrhea.  Musculoskeletal: Positive for myalgias.   All other systems are reviewed and are negative for acute change except as noted in the HPI  Physical Exam Vital Signs  I have reviewed the triage vital signs BP 129/83 (BP Location: Left Arm)   Pulse 87   Temp 99.8 F (37.7 C) (Oral)   Resp 16   Ht 6\' 3"  (1.905 m)   Wt 83.9 kg (185 lb)   SpO2 98%   BMI 23.12 kg/m   Physical Exam  Constitutional: He is oriented to person, place, and time. He appears well-developed and well-nourished. No distress.  HENT:  Head: Normocephalic and atraumatic.  Nose: Nose normal.  Eyes: Conjunctivae and EOM are normal. Pupils are equal, round, and reactive to light. Right eye exhibits no discharge. Left eye exhibits no discharge. No scleral icterus.  Neck: Normal range of motion. Neck supple.  Cardiovascular: Normal rate and regular rhythm. Exam reveals no gallop and no friction rub.   No murmur heard. Pulmonary/Chest: Effort normal and breath sounds normal. No stridor. No respiratory distress. He has no rales.  Abdominal: Soft. He exhibits no distension. There is generalized tenderness (mostly with light touch to musculature). There is guarding. There is no rigidity, no rebound and no CVA tenderness.  Musculoskeletal: He exhibits no edema or tenderness.  Neurological: He is alert and oriented to person, place, and time.  Skin: Skin is warm and dry. No rash noted. He is not diaphoretic. No erythema.  Psychiatric: He has a normal mood and affect.  Vitals reviewed.   ED Results and Treatments Labs (all labs ordered are listed, but only abnormal results are displayed) Labs Reviewed  COMPREHENSIVE METABOLIC PANEL - Abnormal; Notable for the following components:      Result Value   Potassium 3.4 (*)    Total Protein 8.5 (*)    ALT 16 (*)    All other components within normal limits  LIPASE, BLOOD  CBC                                                                                                                         EKG  EKG Interpretation  Date/Time:    Ventricular Rate:    PR Interval:    QRS Duration:   QT Interval:    QTC Calculation:   R Axis:     Text Interpretation:        Radiology Dg Abd Acute W/chest  Result Date: 02/10/2018 CLINICAL DATA:  22 year old male with upper abdominal pain, nausea and hematemesis EXAM: DG ABDOMEN ACUTE W/ 1V CHEST COMPARISON:  Prior acute abdominal series 07/11/2017 FINDINGS: There is no evidence of dilated bowel loops or free intraperitoneal air. No radiopaque calculi or other significant radiographic abnormality is seen. Heart size and mediastinal contours are within  normal limits. Both lungs are clear. IMPRESSION: Negative abdominal radiographs.  No acute cardiopulmonary disease. Electronically Signed   By: Malachy Moan M.D.   On: 02/10/2018 09:29   Pertinent labs & imaging results that were available during my  care of the patient were reviewed by me and considered in my medical decision making (see chart for details).  Medications Ordered in ED Medications  ondansetron (ZOFRAN-ODT) disintegrating tablet 4 mg (4 mg Oral Given 02/10/18 0312)  gi cocktail (Maalox,Lidocaine,Donnatal) (30 mLs Oral Given 02/10/18 0941)  ondansetron (ZOFRAN) injection 4 mg (4 mg Intravenous Given 02/10/18 0940)  sodium chloride 0.9 % bolus 1,000 mL (1,000 mLs Intravenous New Bag/Given 02/10/18 5366)                                                                                                                                    Procedures Procedures  (including critical care time)  Medical Decision Making / ED Course I have reviewed the nursing notes for this encounter and the patient's prior records (if available in EHR or on provided paperwork).    Patient here with diffuse abdominal discomfort with nausea and reported hematemesis.  Labs grossly reassuring without leukocytosis, anemia.  Chemistry panel without significant electrolyte derangements or renal insufficiency.  No biliary obstruction no evidence of pancreatitis.  Acute abdominal series without evidence of pneumoperitoneum or bowel obstruction.  Did reveal significant stool burden in the large bowel.  Patient does endorse history of constipation.  Treated symptomatically with antiemetics, IV fluid and GI cocktail.  Patient is able to tolerate oral intake.  Low suspicion for serious intra-abdominal inflammatory/infectious process.  The patient appears reasonably screened and/or stabilized for discharge and I doubt any other medical condition or other [facility] requiring further screening, evaluation, or treatment in the ED at this time prior to discharge.  The patient is safe for discharge with strict return precautions.   Final Clinical Impression(s) / ED Diagnoses Final diagnoses:  Abdominal pain  Generalized abdominal pain  Hematemesis with nausea     Disposition: Discharge  Condition: Good  I have discussed the results, Dx and Tx plan with the patient who expressed understanding and agree(s) with the plan. Discharge instructions discussed at great length. The patient was given strict return precautions who verbalized understanding of the instructions. No further questions at time of discharge.    ED Discharge Orders        Ordered    ondansetron (ZOFRAN ODT) 4 MG disintegrating tablet  Every 8 hours PRN     02/10/18 1046    polyethylene glycol powder (MIRALAX) powder     02/10/18 1046    dicyclomine (BENTYL) 20 MG tablet  2 times daily     02/10/18 1046       Follow Up: Primary care provider  Schedule an appointment as soon as possible for a visit  in 3-5 days, If symptoms do not improve or  worsen  Gastroenterology  Schedule an appointment as soon as possible for a visit  As scheduled for endoscopy     This chart was dictated using voice recognition software.  Despite best efforts to proofread,  errors can occur which can change the documentation meaning.   Nira Conn, MD 02/10/18 1047

## 2018-05-23 ENCOUNTER — Emergency Department (HOSPITAL_COMMUNITY)
Admission: EM | Admit: 2018-05-23 | Discharge: 2018-05-23 | Disposition: A | Discharge status: Home / Self Care | Payer: 59 | Attending: Emergency Medicine | Admitting: Emergency Medicine

## 2018-05-23 ENCOUNTER — Encounter (HOSPITAL_COMMUNITY): Payer: Self-pay | Admitting: Emergency Medicine

## 2018-05-23 DIAGNOSIS — R300 Dysuria: Secondary | ICD-10-CM | POA: Diagnosis not present

## 2018-05-23 DIAGNOSIS — L0291 Cutaneous abscess, unspecified: Secondary | ICD-10-CM | POA: Insufficient documentation

## 2018-05-23 DIAGNOSIS — Z79899 Other long term (current) drug therapy: Secondary | ICD-10-CM | POA: Insufficient documentation

## 2018-05-23 DIAGNOSIS — F17290 Nicotine dependence, other tobacco product, uncomplicated: Secondary | ICD-10-CM | POA: Insufficient documentation

## 2018-05-23 LAB — URINALYSIS, ROUTINE W REFLEX MICROSCOPIC
Bilirubin Urine: NEGATIVE
Glucose, UA: NEGATIVE mg/dL
Hgb urine dipstick: NEGATIVE
Ketones, ur: NEGATIVE mg/dL
Leukocytes, UA: NEGATIVE
Nitrite: NEGATIVE
Protein, ur: NEGATIVE mg/dL
Specific Gravity, Urine: 1.024 (ref 1.005–1.030)
pH: 6 (ref 5.0–8.0)

## 2018-05-23 MED ORDER — LIDOCAINE HCL (PF) 1 % IJ SOLN
INTRAMUSCULAR | Status: AC
  Filled 2018-05-23: qty 5

## 2018-05-23 MED ORDER — DOXYCYCLINE HYCLATE 100 MG PO CAPS: 100.0000 mg | ORAL_CAPSULE | Freq: Two times a day (BID) | ORAL | 0 refills | Status: DC

## 2018-05-23 MED ORDER — CEFTRIAXONE SODIUM 250 MG IJ SOLR
250.0000 mg | Freq: Once | INTRAMUSCULAR | Status: AC
  Administered 2018-05-23: 250 mg via INTRAMUSCULAR
  Filled 2018-05-23: qty 250

## 2018-05-23 MED ORDER — DOXYCYCLINE HYCLATE 100 MG PO TABS
100.0000 mg | ORAL_TABLET | Freq: Once | ORAL | Status: AC
  Administered 2018-05-23: 100 mg via ORAL
  Filled 2018-05-23: qty 1

## 2018-05-23 NOTE — ED Notes (Signed)
Patient left at this time with all belongings. 

## 2018-05-23 NOTE — ED Notes (Signed)
Witnessed rectal exam performed by [facility] PA

## 2018-05-23 NOTE — Discharge Instructions (Addendum)
Please read attached information. If you experience any new or worsening signs or symptoms please return to the emergency room for evaluation. Please follow-up with your primary care provider or specialist as discussed. Please use medication prescribed only as directed and discontinue taking if you have any concerning signs or symptoms.   °

## 2018-05-23 NOTE — ED Triage Notes (Signed)
Pt states he thinks he has a boil "on his gooch". Pt also wants to have an STD test bc "I just feel like it's that time of the year!"

## 2018-05-23 NOTE — ED Provider Notes (Signed)
[facility] EMERGENCY DEPARTMENT Provider Note   CSN: [id number] Arrival date & time: 05/23/18  1642     History   Chief Complaint Chief Complaint  Patient presents with  . Recurrent Skin Infections    HPI Pearley Baranek is a 22 y.o. male.  HPI   22 year old male presents today with complaints of abscess.  Patient notes he noted over the last 4 days swelling to the left lower gluteus and inner thigh, he notes this ruptured while here and has had purulent drainage.  Patient notes since the rupture symptoms have improved.  Patient also reports that for an unknown amount of time he has had intermittent burning with urination, and is concerned for STD, denies any purulence from his penis, denies any abdominal pain, fever chills nausea or vomiting.  Past Medical History:  Diagnosis Date  . Heart murmur     There are no active problems to display for this patient.   Past Surgical History:  Procedure Laterality Date  . CARDIAC SURGERY          Home Medications    Prior to Admission medications   Medication Sig Start Date End Date Taking? Authorizing Provider  cyclobenzaprine (FLEXERIL) 10 MG tablet Take 1 tablet (10 mg total) by mouth 2 (two) times daily as needed for muscle spasms. Patient not taking: Reported on 02/10/2018 09/28/17   Kellie Shropshire, PA-C  dicyclomine (BENTYL) 20 MG tablet Take 1 tablet (20 mg total) by mouth 2 (two) times daily. 02/10/18   Cardama, Amadeo Garnet, MD  doxycycline (VIBRAMYCIN) 100 MG capsule Take 1 capsule (100 mg total) by mouth 2 (two) times daily. 05/23/18   Deyja Sochacki, Tinnie Gens, PA-C  polyethylene glycol powder (MIRALAX) powder Take 3 capfuls for day 1 and 2. On day 3 start taking 1 capful 3 times a day. Slowly cut back as needed until you have normal bowel movements 02/10/18   Cardama, Amadeo Garnet, MD  promethazine (PHENERGAN) 25 MG tablet Take 1 tablet (25 mg total) by mouth every 6 (six) hours as needed for nausea or  vomiting. Patient not taking: Reported on 02/10/2018 07/19/16   Charlestine Night, PA-C  ranitidine (ZANTAC) 150 MG tablet Take 1 tablet (150 mg total) by mouth 2 (two) times daily. Patient not taking: Reported on 02/10/2018 07/11/17 07/11/18  Willy Eddy, MD    Family History History reviewed. No pertinent family history.  Social History Social History   Tobacco Use  . Smoking status: Current Every Day Smoker    Types: E-cigarettes  . Smokeless tobacco: Never Used  Substance Use Topics  . Alcohol use: Yes  . Drug use: Yes    Types: Marijuana     Allergies   Patient has no known allergies.   Review of Systems Review of Systems  All other systems reviewed and are negative.    Physical Exam Updated Vital Signs BP 138/84 (BP Location: Right Arm)   Pulse 69   Temp 98.8 F (37.1 C) (Oral)   Resp 16   Ht  (1.93 m)   Wt 86.2 kg (190 lb)   SpO2 100%   BMI 23.13 kg/m   Physical Exam  Constitutional: He is oriented to person, place, and time. He appears well-developed and well-nourished.  HENT:  Head: Normocephalic and atraumatic.  Eyes: Pupils are equal, round, and reactive to light. Conjunctivae are normal. Right eye exhibits no discharge. Left eye exhibits no discharge. No scleral icterus.  Neck: Normal range of motion. No JVD  present. No tracheal deviation present.  Pulmonary/Chest: Effort normal. No stridor.  Musculoskeletal:  2 cm area of induration to the left lower gluteus and inner thigh, purulent discharge noted  Neurological: He is alert and oriented to person, place, and time. Coordination normal.  Psychiatric: He has a normal mood and affect. His behavior is normal. Judgment and thought content normal.  Nursing note and vitals reviewed.    ED Treatments / Results  Labs (all labs ordered are listed, but only abnormal results are displayed) Labs Reviewed  URINALYSIS, ROUTINE W REFLEX MICROSCOPIC  GC/CHLAMYDIA PROBE AMP ([place]) NOT AT  [facility]    EKG None  Radiology No results found.  Procedures Procedures (including critical care time)  Medications Ordered in ED Medications  lidocaine (PF) (XYLOCAINE) 1 % injection (has no administration in time range)  doxycycline (VIBRA-TABS) tablet 100 mg (has no administration in time range)  cefTRIAXone (ROCEPHIN) injection 250 mg (250 mg Intramuscular Given 05/23/18 2020)     Initial Impression / Assessment and Plan / ED Course  I have reviewed the triage vital signs and the nursing notes.  Pertinent labs & imaging results that were available during my care of the patient were reviewed by me and considered in my medical decision making (see chart for details).      Labs: Urinalysis  Imaging:  Consults:  Therapeutics: Ceftriaxone  Discharge Meds: Doxycycline  Assessment/Plan:   22 year old male presents today with abscess.  This is Artie ruptured and draining, no need for I&D at this time.  Patient will be placed on antibiotics.  Patient is here for STD testing and treatment as well, he will be placed on doxycycline to cover for both abscess and STD along with ceftriaxone here.  Patient given strict return precautions, follow-up information.  He verbalized understanding and agreement to today's plan had no further questions or concerns.     Final Clinical Impressions(s) / ED Diagnoses   Final diagnoses:  Abscess  Dysuria    ED Discharge Orders        Ordered    doxycycline (VIBRAMYCIN) 100 MG capsule  2 times daily     05/23/18 2107       Eyvonne Mechanic, PA-C 05/23/18 2109    Pricilla Loveless, MD 05/24/18 2257

## 2018-05-24 LAB — GC/CHLAMYDIA PROBE AMP (~~LOC~~) NOT AT ARMC
Chlamydia: POSITIVE — AB
Neisseria Gonorrhea: NEGATIVE

## 2018-09-04 ENCOUNTER — Emergency Department (HOSPITAL_COMMUNITY): Payer: 59

## 2018-09-04 ENCOUNTER — Other Ambulatory Visit: Payer: Self-pay

## 2018-09-04 ENCOUNTER — Emergency Department (HOSPITAL_COMMUNITY)
Admission: EM | Admit: 2018-09-04 | Discharge: 2018-09-04 | Disposition: A | Discharge status: Home / Self Care | Payer: 59 | Attending: Emergency Medicine | Admitting: Emergency Medicine

## 2018-09-04 ENCOUNTER — Encounter (HOSPITAL_COMMUNITY): Payer: Self-pay | Admitting: Emergency Medicine

## 2018-09-04 DIAGNOSIS — R112 Nausea with vomiting, unspecified: Secondary | ICD-10-CM | POA: Insufficient documentation

## 2018-09-04 DIAGNOSIS — R079 Chest pain, unspecified: Secondary | ICD-10-CM

## 2018-09-04 DIAGNOSIS — F17290 Nicotine dependence, other tobacco product, uncomplicated: Secondary | ICD-10-CM | POA: Insufficient documentation

## 2018-09-04 DIAGNOSIS — Z79899 Other long term (current) drug therapy: Secondary | ICD-10-CM | POA: Diagnosis not present

## 2018-09-04 DIAGNOSIS — F1210 Cannabis abuse, uncomplicated: Secondary | ICD-10-CM | POA: Insufficient documentation

## 2018-09-04 LAB — I-STAT TROPONIN, ED: TROPONIN I, POC: 0.02 ng/mL (ref 0.00–0.08)

## 2018-09-04 LAB — CBC
HCT: 41.8 % (ref 39.0–52.0)
Hemoglobin: 13.9 g/dL (ref 13.0–17.0)
MCH: 31.6 pg (ref 26.0–34.0)
MCHC: 33.3 g/dL (ref 30.0–36.0)
MCV: 95 fL (ref 78.0–100.0)
Platelets: 274 10*3/uL (ref 150–400)
RBC: 4.4 MIL/uL (ref 4.22–5.81)
RDW: 12.8 % (ref 11.5–15.5)
WBC: 10 10*3/uL (ref 4.0–10.5)

## 2018-09-04 LAB — COMPREHENSIVE METABOLIC PANEL
ALT: 17 U/L (ref 0–44)
AST: 22 U/L (ref 15–41)
Albumin: 4.4 g/dL (ref 3.5–5.0)
Alkaline Phosphatase: 69 U/L (ref 38–126)
Anion gap: 10 (ref 5–15)
BILIRUBIN TOTAL: 1.3 mg/dL — AB (ref 0.3–1.2)
BUN: 8 mg/dL (ref 6–20)
CALCIUM: 9.9 mg/dL (ref 8.9–10.3)
CO2: 24 mmol/L (ref 22–32)
CREATININE: 0.91 mg/dL (ref 0.61–1.24)
Chloride: 104 mmol/L (ref 98–111)
GFR calc Af Amer: >60 mL/min (ref >60)
Glucose, Bld: 93 mg/dL (ref 70–99)
POTASSIUM: 3.8 mmol/L (ref 3.5–5.1)
Sodium: 138 mmol/L (ref 135–145)
TOTAL PROTEIN: 8.2 g/dL — AB (ref 6.5–8.1)

## 2018-09-04 LAB — LIPASE, BLOOD: LIPASE: 29 U/L (ref 11–51)

## 2018-09-04 MED ORDER — METOCLOPRAMIDE HCL 10 MG PO TABS: 10.0000 mg | ORAL_TABLET | Freq: Four times a day (QID) | ORAL | 0 refills | Status: DC

## 2018-09-04 MED ORDER — METOCLOPRAMIDE HCL 5 MG/ML IJ SOLN
10.0000 mg | Freq: Once | INTRAMUSCULAR | Status: AC
  Administered 2018-09-04: 10 mg via INTRAVENOUS
  Filled 2018-09-04: qty 2

## 2018-09-04 MED ORDER — PANTOPRAZOLE SODIUM 40 MG PO TBEC
40.0000 mg | DELAYED_RELEASE_TABLET | Freq: Every day | ORAL | Status: DC
  Administered 2018-09-04: 40 mg via ORAL
  Filled 2018-09-04 (×2): qty 1

## 2018-09-04 MED ORDER — OMEPRAZOLE 20 MG PO CPDR: 20.0000 mg | DELAYED_RELEASE_CAPSULE | Freq: Every day | ORAL | 0 refills | Status: DC

## 2018-09-04 MED ORDER — DIPHENHYDRAMINE HCL 50 MG/ML IJ SOLN
25.0000 mg | Freq: Once | INTRAMUSCULAR | Status: AC
  Administered 2018-09-04: 25 mg via INTRAVENOUS
  Filled 2018-09-04: qty 1

## 2018-09-04 MED ORDER — GI COCKTAIL ~~LOC~~
30.0000 mL | Freq: Once | ORAL | Status: AC
  Administered 2018-09-04: 30 mL via ORAL
  Filled 2018-09-04: qty 30

## 2018-09-04 MED ORDER — SUCRALFATE 1 G PO TABS: 1.0000 g | ORAL_TABLET | Freq: Three times a day (TID) | ORAL | 0 refills | Status: AC

## 2018-09-04 MED ORDER — SODIUM CHLORIDE 0.9 % IV BOLUS
1000.0000 mL | Freq: Once | INTRAVENOUS | Status: AC
  Administered 2018-09-04: 1000 mL via INTRAVENOUS

## 2018-09-04 NOTE — ED Notes (Signed)
Reviewed d/c instructions with pt, who verbalized understanding and had no outstanding questions. Pt departed in NAD, refused use of wheelchair.   

## 2018-09-04 NOTE — Discharge Instructions (Addendum)
Please read and follow all provided instructions.  Your diagnoses today include:  1. Non-intractable vomiting with nausea, unspecified vomiting type   2. Chest pain, unspecified type   3. Cannabis use disorder, mild, abuse     Tests performed today include: Blood counts and electrolytes Blood tests to check liver and kidney function Blood tests to check pancreas function Urine test to look for infection and pregnancy (in women) Vital signs. See below for your results today.   Medications prescribed:   Take any prescribed medications only as directed.  Please start taking a medicine called Carafate or sucralfate.  This can be taken up to 4 times a day, with meals and before bedtime.  Please do not take your proton pump inhibitor (Protonix, Nexium, Prilosec) within 30 minutes of taking Carafate.  Please begin taking omeprazole, 20 mg daily for 2 weeks.  You are prescribed Reglan, and medication for nausea.  He may take one 10 mg pill every 6 hours as needed for nausea.   Home care instructions:  Follow any educational materials contained in this packet.  Your abdominal pain, nausea, vomiting, and diarrhea may be caused by a viral gastroenteritis also called 'stomach flu'. You should rest for the next several days. Keep drinking plenty of fluids and use the medicine for nausea as directed.   Drink clear liquids for the next 24 hours and introduce solid foods slowly after 24 hours using the b.r.a.t. diet (Bananas, Rice, Applesauce, Toast, Yogurt).    Follow-up instructions: Please follow-up with your primary care provider in the next 2 days for further evaluation of your symptoms. If you are not feeling better in 48 hours you may have a condition that is more serious and you need re-evaluation.   Return instructions:  SEEK IMMEDIATE MEDICAL ATTENTION IF: If you have pain that does not go away or becomes severe  A temperature above 101F develops  Repeated vomiting occurs (multiple  episodes)  If you have pain that becomes localized to portions of the abdomen. The right side could possibly be appendicitis. In an adult, the left lower portion of the abdomen could be colitis or diverticulitis.  Blood is being passed in stools or vomit (bright red or black tarry stools)  You develop chest pain, difficulty breathing, dizziness or fainting, or become confused, poorly responsive, or inconsolable (young children) If you have any other emergent concerns regarding your health  Additional Information: Abdominal (belly) pain can be caused by many things. Your caregiver performed an examination and possibly ordered blood/urine tests and imaging (CT scan, x-rays, ultrasound). Many cases can be observed and treated at home after initial evaluation in the emergency department. Even though you are being discharged home, abdominal pain can be unpredictable. Therefore, you need a repeated exam if your pain does not resolve, returns, or worsens. Most patients with abdominal pain don't have to be admitted to the hospital or have surgery, but serious problems like appendicitis and gallbladder attacks can start out as nonspecific pain. Many abdominal conditions cannot be diagnosed in one visit, so follow-up evaluations are very important.  Your vital signs today were: BP 121/66    Pulse 69    Temp 99.1 F (37.3 C)    Resp 20    Ht 6\' 3"  (1.905 m)    Wt 85.7 kg    SpO2 99%    BMI 23.62 kg/m  If your blood pressure (bp) was elevated above 135/85 this visit, please have this repeated by your doctor within one  month. --------------

## 2018-09-04 NOTE — ED Notes (Signed)
Patient transported to X-ray 

## 2018-09-04 NOTE — ED Triage Notes (Signed)
Pt checked in for c.o. Chest pain. Pt states he went out last night, drank and smoked. Woke up and felt fine. 20 minutes ago he threw up and  began having mid epigastric chest pain, radiating into the back. Pt highly anxious with anxious breathing at triage.

## 2018-09-04 NOTE — ED Provider Notes (Signed)
[facility] EMERGENCY DEPARTMENT Provider Note   CSN: [id number] Arrival date & time: 09/04/18  1156     History   Chief Complaint Chief Complaint  Patient presents with  . Chest Pain  . Emesis    HPI Wayne Short is a 22 y.o. male.  HPI   Patient is a 22 year old male with a history of heart murmur presenting for emesis and chest pain.  Patient reports that this morning, he woke up after sleeping a couple hours and attempted to smoke a joint of marijuana, when he began having sudden and forceful vomiting, more episodes that he can count.  Patient reports after a couple episodes, he began seeing streaks of blood in the vomit.  Patient reports that he was coughing and sputtering had difficulty catching his breath.  Patient reports no shortness of breath at present, but did have one more episode of emesis in the emergency department.  Patient reports lower chest pain rating into the back at present.  Patient denies any lower abdominal pain, fevers, chills, or diarrhea.  Patient does report that he drank approximately 3 alcoholic drinks last night, and smoked marijuana, which he smokes daily.  No family history or personal history of cardia vascular disease.  No history of pulmonary disease.  Past Medical History:  Diagnosis Date  . Heart murmur     There are no active problems to display for this patient.   Past Surgical History:  Procedure Laterality Date  . CARDIAC SURGERY          Home Medications    Prior to Admission medications   Medication Sig Start Date End Date Taking? Authorizing Provider  cyclobenzaprine (FLEXERIL) 10 MG tablet Take 1 tablet (10 mg total) by mouth 2 (two) times daily as needed for muscle spasms. Patient not taking: Reported on 02/10/2018 09/28/17   Kellie Shropshire, PA-C  dicyclomine (BENTYL) 20 MG tablet Take 1 tablet (20 mg total) by mouth 2 (two) times daily. 02/10/18   Cardama, Amadeo Garnet, MD  doxycycline (VIBRAMYCIN)  100 MG capsule Take 1 capsule (100 mg total) by mouth 2 (two) times daily. 05/23/18   Hedges, Tinnie Gens, PA-C  polyethylene glycol powder (MIRALAX) powder Take 3 capfuls for day 1 and 2. On day 3 start taking 1 capful 3 times a day. Slowly cut back as needed until you have normal bowel movements 02/10/18   Cardama, Amadeo Garnet, MD  promethazine (PHENERGAN) 25 MG tablet Take 1 tablet (25 mg total) by mouth every 6 (six) hours as needed for nausea or vomiting. Patient not taking: Reported on 02/10/2018 07/19/16   Charlestine Night, PA-C  ranitidine (ZANTAC) 150 MG tablet Take 1 tablet (150 mg total) by mouth 2 (two) times daily. Patient not taking: Reported on 02/10/2018 07/11/17 07/11/18  Willy Eddy, MD    Family History No family history on file.  Social History Social History   Tobacco Use  . Smoking status: Current Every Day Smoker    Types: E-cigarettes  . Smokeless tobacco: Never Used  Substance Use Topics  . Alcohol use: Yes  . Drug use: Yes    Types: Marijuana     Allergies   Patient has no known allergies.   Review of Systems Review of Systems  Constitutional: Negative for chills and fever.  HENT: Negative for congestion, rhinorrhea, sinus pain and sore throat.   Eyes: Negative for visual disturbance.  Respiratory: Negative for cough, chest tightness and shortness of breath.   Cardiovascular: Positive for  chest pain. Negative for palpitations and leg swelling.  Gastrointestinal: Positive for abdominal pain, nausea and vomiting. Negative for constipation and diarrhea.  Genitourinary: Negative for dysuria and flank pain.  Musculoskeletal: Negative for back pain and myalgias.  Skin: Negative for rash.  Neurological: Negative for dizziness, syncope, light-headedness and headaches.     Physical Exam Updated Vital Signs BP 121/66   Pulse 73   Temp 99.1 F (37.3 C)   Resp (!) 22   Ht 6\' 3"  (1.905 m)   Wt 85.7 kg   SpO2 98%   BMI 23.62 kg/m   Physical Exam    Constitutional: He appears well-developed and well-nourished. No distress.  HENT:  Head: Normocephalic and atraumatic.  Mouth/Throat: Oropharynx is clear and moist.  Eyes: Pupils are equal, round, and reactive to light. Conjunctivae and EOM are normal.  Neck: Normal range of motion. Neck supple.  Cardiovascular: Normal rate, regular rhythm, S1 normal and S2 normal.  No murmur heard. Pulmonary/Chest: Effort normal and breath sounds normal. He has no wheezes. He has no rales.  Abdominal: Soft. He exhibits no distension. There is tenderness. There is no guarding.  Mild epigastric tenderness to palpation without guarding or rebound.  Musculoskeletal: Normal range of motion. He exhibits no edema or deformity.  Neurological: He is alert.  Cranial nerves grossly intact. Patient moves extremities symmetrically and with good coordination.  Skin: Skin is warm and dry. No rash noted. No erythema.  Psychiatric: He has a normal mood and affect. His behavior is normal. Judgment and thought content normal.  Nursing note and vitals reviewed.    ED Treatments / Results  Labs (all labs ordered are listed, but only abnormal results are displayed) Labs Reviewed  COMPREHENSIVE METABOLIC PANEL - Abnormal; Notable for the following components:      Result Value   Total Protein 8.2 (*)    Total Bilirubin 1.3 (*)    All other components within normal limits  CBC  LIPASE, BLOOD  I-STAT TROPONIN, ED    EKG EKG Interpretation  Date/Time:  Sunday September 04 2018 12:04:59 EDT Ventricular Rate:  101 PR Interval:  150 QRS Duration: 88 QT Interval:  326 QTC Calculation: 422 R Axis:   84 Text Interpretation:  Sinus tachycardia ST elevation, consider early repolarization , similar to previous tracing in sep 2017 Borderline ECG Confirmed by Linwood Dibbles [phone]) on 09/04/2018 2:50:21 PM   Radiology Dg Chest 2 View  Result Date: 09/04/2018 CLINICAL DATA:  Chest pain for 2 weeks. Acute epigastric pain and  vomiting after drinking alcohol. EXAM: CHEST - 2 VIEW COMPARISON:  None. FINDINGS: The cardiomediastinal silhouette is within normal limits. The lungs are well inflated and clear. There is no evidence of pleural effusion or pneumothorax. No acute osseous abnormality is identified. IMPRESSION: No active cardiopulmonary disease. Electronically Signed   By: Sebastian Ache M.D.   On: 09/04/2018 13:03    Procedures Procedures (including critical care time)  Medications Ordered in ED Medications  sodium chloride 0.9 % bolus 1,000 mL (1,000 mLs Intravenous New Bag/Given 09/04/18 1339)  metoCLOPramide (REGLAN) injection 10 mg (10 mg Intravenous Given 09/04/18 1338)  diphenhydrAMINE (BENADRYL) injection 25 mg (25 mg Intravenous Given 09/04/18 1338)  gi cocktail (Maalox,Lidocaine,Donnatal) (30 mLs Oral Given 09/04/18 1336)     Initial Impression / Assessment and Plan / ED Course  I have reviewed the triage vital signs and the nursing notes.  Pertinent labs & imaging results that were available during my care of the patient were  reviewed by me and considered in my medical decision making (see chart for details).     Patient nontoxic-appearing, afebrile, no acute distress.  Patient with benign abdomen.  Diagnosis includes gastroenteritis, Mallory-Weiss syndrome, cannabis hyperemesis.  I doubt pulmonary embolism, as patient is Wells and PERC negative, no major risk factors, not tachycardic and hemodynamically stable with oxygen saturation 99% on room air.  Initial tachycardic reading to 101 in triage, but resolved on my evaluation. Doubt ACS given age, lack of risk factors, and atypical history.  Patient rehydrated, emesis controlled, and patient given GI cocktail emergency department.  Given history of hematemesis, will discharge with 2 weeks of omeprazole p.o.  Patient given GI follow-up.  Return precautions were given for any worsening pain, tach nausea or vomiting.  Patient is in understanding and agrees with  the plan of care.  Final Clinical Impressions(s) / ED Diagnoses   Final diagnoses:  Non-intractable vomiting with nausea, unspecified vomiting type  Chest pain, unspecified type  Cannabis use disorder, mild, abuse    ED Discharge Orders         Ordered    metoCLOPramide (REGLAN) 10 MG tablet  Every 6 hours     09/04/18 1517    omeprazole (PRILOSEC) 20 MG capsule  Daily     09/04/18 1517    sucralfate (CARAFATE) 1 g tablet  3 times daily with meals & bedtime     09/04/18 1517           Elisha Ponder, PA-C 09/04/18 1519    Linwood Dibbles, MD 09/06/18 [phone]

## 2018-09-07 NOTE — ED Notes (Signed)
Pt called and asked for a copy of his work note he recently got.  Pt will come pick it up

## 2018-11-11 ENCOUNTER — Emergency Department (HOSPITAL_COMMUNITY): Payer: 59

## 2018-11-11 ENCOUNTER — Emergency Department (HOSPITAL_COMMUNITY)
Admission: EM | Admit: 2018-11-11 | Discharge: 2018-11-11 | Disposition: A | Discharge status: Home / Self Care | Payer: 59 | Attending: Emergency Medicine | Admitting: Emergency Medicine

## 2018-11-11 ENCOUNTER — Encounter (HOSPITAL_COMMUNITY): Payer: Self-pay

## 2018-11-11 DIAGNOSIS — R1013 Epigastric pain: Secondary | ICD-10-CM | POA: Diagnosis present

## 2018-11-11 DIAGNOSIS — R17 Unspecified jaundice: Secondary | ICD-10-CM

## 2018-11-11 DIAGNOSIS — F17290 Nicotine dependence, other tobacco product, uncomplicated: Secondary | ICD-10-CM | POA: Diagnosis not present

## 2018-11-11 DIAGNOSIS — R112 Nausea with vomiting, unspecified: Secondary | ICD-10-CM | POA: Diagnosis not present

## 2018-11-11 DIAGNOSIS — F1210 Cannabis abuse, uncomplicated: Secondary | ICD-10-CM | POA: Insufficient documentation

## 2018-11-11 DIAGNOSIS — Z79899 Other long term (current) drug therapy: Secondary | ICD-10-CM | POA: Insufficient documentation

## 2018-11-11 LAB — URINALYSIS, ROUTINE W REFLEX MICROSCOPIC
Bilirubin Urine: NEGATIVE
Glucose, UA: NEGATIVE mg/dL
Hgb urine dipstick: NEGATIVE
Ketones, ur: NEGATIVE mg/dL
Leukocytes, UA: NEGATIVE
Nitrite: NEGATIVE
Protein, ur: NEGATIVE mg/dL
Specific Gravity, Urine: 1.023 (ref 1.005–1.030)
pH: 7 (ref 5.0–8.0)

## 2018-11-11 LAB — CBC WITH DIFFERENTIAL/PLATELET
Abs Immature Granulocytes: 0.01 10*3/uL (ref 0.00–0.07)
BASOS PCT: 0 %
Basophils Absolute: 0 10*3/uL (ref 0.0–0.1)
EOS ABS: 0.3 10*3/uL (ref 0.0–0.5)
EOS PCT: 4 %
HCT: 41.6 % (ref 39.0–52.0)
Hemoglobin: 13.8 g/dL (ref 13.0–17.0)
IMMATURE GRANULOCYTES: 0 %
Lymphocytes Relative: 20 %
Lymphs Abs: 1.3 10*3/uL (ref 0.7–4.0)
MCH: 30.9 pg (ref 26.0–34.0)
MCHC: 33.2 g/dL (ref 30.0–36.0)
MCV: 93.3 fL (ref 80.0–100.0)
MONOS PCT: 7 %
Monocytes Absolute: 0.4 10*3/uL (ref 0.1–1.0)
NEUTROS PCT: 69 %
Neutro Abs: 4.5 10*3/uL (ref 1.7–7.7)
PLATELETS: 259 10*3/uL (ref 150–400)
RBC: 4.46 MIL/uL (ref 4.22–5.81)
RDW: 12.4 % (ref 11.5–15.5)
WBC: 6.6 10*3/uL (ref 4.0–10.5)
nRBC: 0 % (ref 0.0–0.2)

## 2018-11-11 LAB — COMPREHENSIVE METABOLIC PANEL
ALT: 18 U/L (ref 0–44)
AST: 25 U/L (ref 15–41)
Albumin: 4.7 g/dL (ref 3.5–5.0)
Alkaline Phosphatase: 59 U/L (ref 38–126)
Anion gap: 9 (ref 5–15)
BUN: 8 mg/dL (ref 6–20)
CO2: 28 mmol/L (ref 22–32)
Calcium: 10.4 mg/dL — ABNORMAL HIGH (ref 8.9–10.3)
Chloride: 101 mmol/L (ref 98–111)
Creatinine, Ser: 0.89 mg/dL (ref 0.61–1.24)
GFR calc Af Amer: >60 mL/min (ref >60)
GFR calc non Af Amer: >60 mL/min (ref >60)
Glucose, Bld: 82 mg/dL (ref 70–99)
Potassium: 5.2 mmol/L — ABNORMAL HIGH (ref 3.5–5.1)
Sodium: 138 mmol/L (ref 135–145)
Total Bilirubin: 2 mg/dL — ABNORMAL HIGH (ref 0.3–1.2)
Total Protein: 8.7 g/dL — ABNORMAL HIGH (ref 6.5–8.1)

## 2018-11-11 LAB — LIPASE, BLOOD: Lipase: 45 U/L (ref 11–51)

## 2018-11-11 MED ORDER — METOCLOPRAMIDE HCL 10 MG PO TABS: 10.0000 mg | ORAL_TABLET | Freq: Three times a day (TID) | ORAL | 0 refills | Status: DC | PRN

## 2018-11-11 MED ORDER — SODIUM CHLORIDE 0.9 % IV BOLUS
1000.0000 mL | Freq: Once | INTRAVENOUS | Status: AC
  Administered 2018-11-11: 1000 mL via INTRAVENOUS

## 2018-11-11 MED ORDER — METOCLOPRAMIDE HCL 5 MG/ML IJ SOLN
10.0000 mg | Freq: Once | INTRAMUSCULAR | Status: AC
  Administered 2018-11-11: 10 mg via INTRAVENOUS
  Filled 2018-11-11: qty 2

## 2018-11-11 MED ORDER — FAMOTIDINE 20 MG PO TABS: 20.0000 mg | ORAL_TABLET | Freq: Two times a day (BID) | ORAL | 0 refills | Status: DC

## 2018-11-11 MED ORDER — ALUM & MAG HYDROXIDE-SIMETH 200-200-20 MG/5ML PO SUSP
30.0000 mL | Freq: Once | ORAL | Status: AC
  Administered 2018-11-11: 30 mL via ORAL
  Filled 2018-11-11: qty 30

## 2018-11-11 NOTE — ED Triage Notes (Signed)
Pt started vomiting this morning

## 2018-11-11 NOTE — ED Provider Notes (Signed)
Care assumed at shift change from [facility] Fawze, [place]PA-C.  See note.  In summation patient presents for evaluation of acute onset nausea and vomiting with right-sided abdominal pain.  Had 4 episodes of nonbilious, nonbloody emesis.  Denies fever, chills, chest pain, shortness of breath, urinary symptoms.  Admits to marijuana use.  Plan: Patient with possible cannabinoid hyperemesis.  Elevated bilirubin on labs.  Right upper ultrasound pending.  Discharge home if ultrasound negative for biliary pathology.  Patient to be sent home with Reglan and Pepcid.  Urinalysis pending. PO Challenge pending.  Physical Exam  BP 130/90 (BP Location: Right Arm)   Pulse 71   Temp 98.3 F (36.8 C) (Oral)   Resp 20   Ht 6\' 3"  (1.905 m)   Wt 81.6 kg   SpO2 97%   BMI 22.50 kg/m   Physical Exam Overall well-appearing in playing on phone on initial exam.  Abdomen without tenderness, rebound or guarding. Negative Murphy sign.  Mucous membranes moist. ED Course/Procedures     Procedures  MDM   22 year old male who appears otherwise well presents for evaluation of acute onset nausea and vomiting with right-sided abdominal pain.  Had 4 episodes of nonbilious, nonbloody emesis.  Denies fever, chills, chest pain, shortness of breath, urinary symptoms.  Admits to marijuana use.   Patient with possible cannabinoid hyperemesis or viral enteritis.  Elevated bilirubin at 2.0.  Right upper quadrant ultrasound negative.  On reevaluation patient abdomen is nontender, without rebound or guarding.  Analysis negative for infection.  Patient is able to tolerate p.o. liquids in the department without difficulty.  Low suspicion for emergent pathology causing patient's nausea and vomiting.  Patient is nontoxic, nonseptic appearing, in no apparent distress.   Patient does not meet the SIRS or Sepsis criteria.  On repeat exam patient does not have a surgical abdomin and there are no peritoneal signs.  No indication of appendicitis, bowel  obstruction, bowel perforation, cholecystitis, diverticulitis. Discussed strict return precautions with patient.  Patient is hemodynamically stable and appropriate for DC home at this time.  Will DC home with Reglan and Pepcid.  Patient voiced understanding of return precautions and is agreeable for follow-up.       Linwood DibblesHenderly, Rayvn Rickerson A, PA-C 11/11/18 1614    Pricilla LovelessGoldston, Scott, MD 11/11/18 [phone]

## 2018-11-11 NOTE — ED Notes (Signed)
Patient verbalizes understanding of discharge instructions. Opportunity for questioning and answers were provided. Armband removed by staff, pt discharged from ED ambulatory. Pt denies repeat vitals.

## 2018-11-11 NOTE — ED Provider Notes (Signed)
[facility] EMERGENCY DEPARTMENT Provider Note   CSN: [id number] Arrival date & time: 11/11/18  1204     History   Chief Complaint Chief Complaint  Patient presents with  . Emesis    HPI Wayne Short is a 22 y.o. male presents for evaluation of acute onset, constant epigastric abdominal pain with associated nausea and vomiting.  Patient states that he awoke approximately 2 hours ago with sharp pain in the epigastric region.  He has had 3 or 4 episodes of nonbloody nonbilious emesis and endorses ongoing nausea.  Denies fevers, chills, chest pain, shortness of breath, urinary symptoms, diarrhea, or constipation.  Has not tried anything for his symptoms.  States he smokes Black and mild cigarettes, marijuana multiple times daily, and drinks alcohol socially.  Notes that he ate a [employer] chicken sandwich yesterday, denies any other suspicious food intake.  No recent treatment with antibiotics or travel outside the country.  The history is provided by the patient.    Past Medical History:  Diagnosis Date  . Heart murmur     There are no active problems to display for this patient.   Past Surgical History:  Procedure Laterality Date  . CARDIAC SURGERY          Home Medications    Prior to Admission medications   Medication Sig Start Date End Date Taking? Authorizing Provider  cyclobenzaprine (FLEXERIL) 10 MG tablet Take 1 tablet (10 mg total) by mouth 2 (two) times daily as needed for muscle spasms. Patient not taking: Reported on 02/10/2018 09/28/17   Kellie Shropshire, PA-C  dicyclomine (BENTYL) 20 MG tablet Take 1 tablet (20 mg total) by mouth 2 (two) times daily. 02/10/18   Cardama, Amadeo Garnet, MD  doxycycline (VIBRAMYCIN) 100 MG capsule Take 1 capsule (100 mg total) by mouth 2 (two) times daily. 05/23/18   Hedges, Tinnie Gens, PA-C  famotidine (PEPCID) 20 MG tablet Take 1 tablet (20 mg total) by mouth 2 (two) times daily. 11/11/18   Orlan Aversa A, PA-C    metoCLOPramide (REGLAN) 10 MG tablet Take 1 tablet (10 mg total) by mouth every 8 (eight) hours as needed for nausea or vomiting. 11/11/18   Walsie Smeltz A, PA-C  omeprazole (PRILOSEC) 20 MG capsule Take 1 capsule (20 mg total) by mouth daily for 14 days. 09/04/18 09/18/18  Aviva Kluver B, PA-C  polyethylene glycol powder (MIRALAX) powder Take 3 capfuls for day 1 and 2. On day 3 start taking 1 capful 3 times a day. Slowly cut back as needed until you have normal bowel movements 02/10/18   Cardama, Amadeo Garnet, MD  promethazine (PHENERGAN) 25 MG tablet Take 1 tablet (25 mg total) by mouth every 6 (six) hours as needed for nausea or vomiting. Patient not taking: Reported on 02/10/2018 07/19/16   Charlestine Night, PA-C  sucralfate (CARAFATE) 1 g tablet Take 1 tablet (1 g total) by mouth 4 (four) times daily -  with meals and at bedtime for 14 days. 09/04/18 09/18/18  Elisha Ponder, PA-C    Family History History reviewed. No pertinent family history.  Social History Social History   Tobacco Use  . Smoking status: Current Every Day Smoker    Types: E-cigarettes  . Smokeless tobacco: Never Used  Substance Use Topics  . Alcohol use: Yes  . Drug use: Yes    Types: Marijuana     Allergies   Patient has no known allergies.   Review of Systems Review of Systems  Constitutional:  Negative for chills and fever.  Respiratory: Negative for shortness of breath.   Cardiovascular: Negative for chest pain.  Gastrointestinal: Positive for abdominal pain, nausea and vomiting. Negative for constipation and diarrhea.  Genitourinary: Negative for dysuria, frequency, hematuria and urgency.  All other systems reviewed and are negative.    Physical Exam Updated Vital Signs BP 130/90 (BP Location: Right Arm)   Pulse 71   Temp 98.3 F (36.8 C) (Oral)   Resp 20   Ht 6\' 3"  (1.905 m)   Wt 81.6 kg   SpO2 97%   BMI 22.50 kg/m   Physical Exam  Constitutional: He appears well-developed and  well-nourished. No distress.  Appears uncomfortable, clutching abdomen.   HENT:  Head: Normocephalic and atraumatic.  Eyes: Conjunctivae are normal. Right eye exhibits no discharge. Left eye exhibits no discharge.  Neck: No JVD present. No tracheal deviation present.  Cardiovascular: Normal rate, regular rhythm and normal heart sounds.  Pulmonary/Chest: Effort normal and breath sounds normal.  Abdominal: Soft. Bowel sounds are normal. He exhibits no distension. There is tenderness in the right upper quadrant and epigastric area. There is no rigidity, no rebound, no guarding, no CVA tenderness and negative Murphy's sign.  Musculoskeletal: He exhibits no edema.  Neurological: He is alert.  Skin: Skin is warm and dry. No erythema.  Psychiatric: He has a normal mood and affect. His behavior is normal.  Nursing note and vitals reviewed.    ED Treatments / Results  Labs (all labs ordered are listed, but only abnormal results are displayed) Labs Reviewed  COMPREHENSIVE METABOLIC PANEL - Abnormal; Notable for the following components:      Result Value   Potassium 5.2 (*)    Calcium 10.4 (*)    Total Protein 8.7 (*)    Total Bilirubin 2.0 (*)    All other components within normal limits  LIPASE, BLOOD  CBC WITH DIFFERENTIAL/PLATELET  CBC WITH DIFFERENTIAL/PLATELET  URINALYSIS, ROUTINE W REFLEX MICROSCOPIC    EKG None  Radiology No results found.  Procedures Procedures (including critical care time)  Medications Ordered in ED Medications  alum & mag hydroxide-simeth (MAALOX/MYLANTA) 200-200-20 MG/5ML suspension 30 mL (has no administration in time range)  sodium chloride 0.9 % bolus 1,000 mL (1,000 mLs Intravenous New Bag/Given 11/11/18 1242)  metoCLOPramide (REGLAN) injection 10 mg (10 mg Intravenous Given 11/11/18 1243)     Initial Impression / Assessment and Plan / ED Course  I have reviewed the triage vital signs and the nursing notes.  Pertinent labs & imaging  results that were available during my care of the patient were reviewed by me and considered in my medical decision making (see chart for details).     Patient presenting for evaluation of nausea, vomiting, and epigastric abdominal pain which began at around 10 AM this morning.  Has been seen for similar presentation multiple times in the past.  Will obtain imaging and plan to control patient symptoms with antiemetics.  He is not actively vomiting.  Lab work reviewed by me shows no leukocytosis.  Lipase and creatinine within normal limits.  He is mildly hyperkalemic and he has had a steady rise in his total bilirubin over the past 9 months per chart review.  Will obtain right upper quadrant ultrasound for further evaluation.  Patient resting comfortably at this time.  3:28 PM Signed out to oncoming provider PA Henderly.  Awaiting results of ultrasound and UA.  Patient has not had any emesis while in the ED.  Likely  stable for discharge home depending on results of imaging.  Will p.o. challenge the patient prior to discharge.  If ultrasound shows evidence of cholecystitis or other acute pathology, may require admission for surgical consultation for further evaluation and management.  Final Clinical Impressions(s) / ED Diagnoses   Final diagnoses:  Total bilirubin, elevated  Non-intractable vomiting with nausea, unspecified vomiting type    ED Discharge Orders         Ordered    metoCLOPramide (REGLAN) 10 MG tablet  Every 8 hours PRN     11/11/18 1517    famotidine (PEPCID) 20 MG tablet  2 times daily     11/11/18 1517           Jeanie SewerFawze, Mikell Kazlauskas A, PA-C 11/11/18 1529    Pricilla LovelessGoldston, Scott, MD 11/11/18 1857

## 2018-11-11 NOTE — Discharge Instructions (Signed)
1. Medications: Take reglan as prescribed as needed for nausea.  Wait around 20 minutes before eating or drinking after taking this medication. Can take pepcid twice daily as needed for upper abdominal pain.  2. Treatment: rest, drink plenty of fluids, advance diet slowly.  Start with water and broth then advance to bland foods that will not upset your stomach such as crackers, mashed potatoes, and peanut butter.  Avoid fast food, fried foods, spicy foods, or fatty foods.  Avoid alcohol.  I would also recommend that you stop smoking marijuana as this may be the cause of your persistent nausea and vomiting. 3. Follow Up: Please followup with your primary doctor in 3 days for discussion of your diagnoses and further evaluation after today's visit; if you do not have a primary care doctor use the resource guide provided to find one; Please return to the ER for persistent vomiting, high fevers or worsening symptoms

## 2019-02-07 ENCOUNTER — Encounter (HOSPITAL_COMMUNITY): Payer: Self-pay | Admitting: Emergency Medicine

## 2019-02-07 ENCOUNTER — Emergency Department (HOSPITAL_COMMUNITY): Payer: Self-pay

## 2019-02-07 ENCOUNTER — Emergency Department (HOSPITAL_COMMUNITY)
Admission: EM | Admit: 2019-02-07 | Discharge: 2019-02-08 | Disposition: A | Discharge status: Home / Self Care | Payer: Self-pay | Attending: Emergency Medicine | Admitting: Emergency Medicine

## 2019-02-07 DIAGNOSIS — J069 Acute upper respiratory infection, unspecified: Secondary | ICD-10-CM | POA: Insufficient documentation

## 2019-02-07 DIAGNOSIS — Z79899 Other long term (current) drug therapy: Secondary | ICD-10-CM | POA: Insufficient documentation

## 2019-02-07 DIAGNOSIS — F17290 Nicotine dependence, other tobacco product, uncomplicated: Secondary | ICD-10-CM | POA: Insufficient documentation

## 2019-02-07 MED ORDER — BENZONATATE 100 MG PO CAPS: 100.0000 mg | ORAL_CAPSULE | Freq: Three times a day (TID) | ORAL | 0 refills | Status: DC | PRN

## 2019-02-07 MED ORDER — NAPROXEN 500 MG PO TABS: 500.0000 mg | ORAL_TABLET | Freq: Two times a day (BID) | ORAL | 0 refills | Status: DC

## 2019-02-07 MED ORDER — NAPROXEN 250 MG PO TABS
500.0000 mg | ORAL_TABLET | Freq: Once | ORAL | Status: AC
  Administered 2019-02-08: 500 mg via ORAL
  Filled 2019-02-07: qty 2

## 2019-02-07 MED ORDER — BENZONATATE 100 MG PO CAPS
100.0000 mg | ORAL_CAPSULE | Freq: Once | ORAL | Status: AC
  Administered 2019-02-08: 100 mg via ORAL
  Filled 2019-02-07: qty 1

## 2019-02-07 MED ORDER — LORATADINE 10 MG PO TABS
10.0000 mg | ORAL_TABLET | Freq: Once | ORAL | Status: AC
  Administered 2019-02-08: 10 mg via ORAL
  Filled 2019-02-07: qty 1

## 2019-02-07 NOTE — ED Triage Notes (Signed)
Pt reports cough X 1 day with subjective fever/chills.

## 2019-02-08 MED ORDER — ONDANSETRON 4 MG PO TBDP
4.0000 mg | ORAL_TABLET | Freq: Once | ORAL | Status: AC
  Administered 2019-02-08: 4 mg via ORAL
  Filled 2019-02-08: qty 1

## 2019-02-08 NOTE — Discharge Instructions (Signed)
We recommend the use of Zyrtec or Claritin for management of congestion.  You may take this with Sudafed which can be purchased behind the counter at your local pharmacy.  You have been prescribed Tessalon to use for cough management.  Take naproxen as prescribed for chest discomfort/pain.  Follow-up with a primary care doctor to ensure that symptoms resolve.

## 2019-02-08 NOTE — ED Provider Notes (Signed)
[facility] EMERGENCY DEPARTMENT Provider Note   CSN: [id number] Arrival date & time: 02/07/19  2208     History   Chief Complaint Chief Complaint  Patient presents with  . Cough    HPI Wayne Short is a 22 y.o. male.  23 year old male presents to the emergency department for evaluation of a cough.  He has been experiencing a cough for 1 week.  Notes production of clear phlegm with occasional posttussive emesis secondary to prolonged coughing.  Symptoms further associated with nasal congestion as well as subjective fever and chills.  He has been taking Mucinex for symptoms without relief.  Now states that he has been experiencing some discomfort in his chest with deep breathing and coughing.  He was around a child recently sick with similar symptoms.  Is able to tolerate food and fluids despite posttussive emesis.  No history of asthma.  The history is provided by the patient. No language interpreter was used.  Cough    Past Medical History:  Diagnosis Date  . Heart murmur     There are no active problems to display for this patient.   Past Surgical History:  Procedure Laterality Date  . CARDIAC SURGERY          Home Medications    Prior to Admission medications   Medication Sig Start Date End Date Taking? Authorizing Provider  benzonatate (TESSALON) 100 MG capsule Take 1 capsule (100 mg total) by mouth 3 (three) times daily as needed for cough. 02/07/19   Antony Madura, PA-C  cyclobenzaprine (FLEXERIL) 10 MG tablet Take 1 tablet (10 mg total) by mouth 2 (two) times daily as needed for muscle spasms. Patient not taking: Reported on 02/10/2018 09/28/17   Kellie Shropshire, PA-C  dicyclomine (BENTYL) 20 MG tablet Take 1 tablet (20 mg total) by mouth 2 (two) times daily. 02/10/18   Cardama, Amadeo Garnet, MD  doxycycline (VIBRAMYCIN) 100 MG capsule Take 1 capsule (100 mg total) by mouth 2 (two) times daily. 05/23/18   Hedges, Tinnie Gens, PA-C  famotidine  (PEPCID) 20 MG tablet Take 1 tablet (20 mg total) by mouth 2 (two) times daily. 11/11/18   Fawze, Mina A, PA-C  metoCLOPramide (REGLAN) 10 MG tablet Take 1 tablet (10 mg total) by mouth every 8 (eight) hours as needed for nausea or vomiting. 11/11/18   Michela Pitcher A, PA-C  naproxen (NAPROSYN) 500 MG tablet Take 1 tablet (500 mg total) by mouth 2 (two) times daily. 02/07/19   Antony Madura, PA-C  omeprazole (PRILOSEC) 20 MG capsule Take 1 capsule (20 mg total) by mouth daily for 14 days. 09/04/18 09/18/18  Aviva Kluver B, PA-C  polyethylene glycol powder (MIRALAX) powder Take 3 capfuls for day 1 and 2. On day 3 start taking 1 capful 3 times a day. Slowly cut back as needed until you have normal bowel movements 02/10/18   Cardama, Amadeo Garnet, MD  promethazine (PHENERGAN) 25 MG tablet Take 1 tablet (25 mg total) by mouth every 6 (six) hours as needed for nausea or vomiting. Patient not taking: Reported on 02/10/2018 07/19/16   Charlestine Night, PA-C  sucralfate (CARAFATE) 1 g tablet Take 1 tablet (1 g total) by mouth 4 (four) times daily -  with meals and at bedtime for 14 days. 09/04/18 09/18/18  Elisha Ponder, PA-C    Family History No family history on file.  Social History Social History   Tobacco Use  . Smoking status: Current Every Day Smoker  Types: E-cigarettes  . Smokeless tobacco: Never Used  Substance Use Topics  . Alcohol use: Yes  . Drug use: Yes    Types: Marijuana     Allergies   Patient has no known allergies.   Review of Systems Review of Systems  Respiratory: Positive for cough.   Ten systems reviewed and are negative for acute change, except as noted in the HPI.    Physical Exam Updated Vital Signs BP 128/70 (BP Location: Right Arm)   Pulse 88   Temp 98.3 F (36.8 C) (Oral)   Resp 18   Ht 6\' 3"  (1.905 m)   Wt 81.6 kg   SpO2 95%   BMI 22.50 kg/m   Physical Exam Vitals signs and nursing note reviewed.  Constitutional:      General: He is not in  acute distress.    Appearance: He is well-developed. He is not diaphoretic.     Comments: Nontoxic appearing and in NAD. Hacking cough on occasion.  HENT:     Head: Normocephalic and atraumatic.     Right Ear: Tympanic membrane, ear canal and external ear normal.     Left Ear: Tympanic membrane, ear canal and external ear normal.     Nose: Congestion present.     Mouth/Throat:     Mouth: Mucous membranes are moist.     Pharynx: Oropharynx is clear.     Comments: Tolerating secretions. Eyes:     General: No scleral icterus.    Conjunctiva/sclera: Conjunctivae normal.  Neck:     Musculoskeletal: Normal range of motion.  Pulmonary:     Effort: Pulmonary effort is normal. No respiratory distress.     Breath sounds: No stridor. No wheezing, rhonchi or rales.     Comments: Lungs CTAB. Respirations even and unlabored. Musculoskeletal: Normal range of motion.  Skin:    General: Skin is warm and dry.     Coloration: Skin is not pale.     Findings: No erythema or rash.  Neurological:     Mental Status: He is alert and oriented to person, place, and time.  Psychiatric:        Behavior: Behavior normal.      ED Treatments / Results  Labs (all labs ordered are listed, but only abnormal results are displayed) Labs Reviewed - No data to display  EKG None  Radiology Dg Chest 2 View  Result Date: 02/07/2019 CLINICAL DATA:  Cough EXAM: CHEST - 2 VIEW COMPARISON:  09/04/2018 FINDINGS: Heart and mediastinal contours are within normal limits. No focal opacities or effusions. No acute bony abnormality. IMPRESSION: No active cardiopulmonary disease. Electronically Signed   By: Charlett Nose M.D.   On: 02/07/2019 23:14    Procedures Procedures (including critical care time)  Medications Ordered in ED Medications  benzonatate (TESSALON) capsule 100 mg (100 mg Oral Given 02/08/19 0013)  loratadine (CLARITIN) tablet 10 mg (10 mg Oral Given 02/08/19 0013)  naproxen (NAPROSYN) tablet 500 mg  (500 mg Oral Given 02/08/19 0013)  ondansetron (ZOFRAN-ODT) disintegrating tablet 4 mg (4 mg Oral Given 02/08/19 0013)     Initial Impression / Assessment and Plan / ED Course  I have reviewed the triage vital signs and the nursing notes.  Pertinent labs & imaging results that were available during my care of the patient were reviewed by me and considered in my medical decision making (see chart for details).     Patient with symptoms of URI. Mild to moderate symptoms of clear/yellow nasal discharge/congestion.  Patient also with cough associated with posttussive emesis on occasion.  Patient is afebrile without hypoxia.  Lungs CTAB.  CXR negative for PNA. Tolerating secretions without clinical signs of dehydration. Suspect viral etiology.    Patient to be discharged with symptomatic treatment.  I have discussed supportive care instructions with the patient who verbalizes understanding.  Encouraged PCP follow-up.  Return precautions discussed and provided.  Patient discharged in stable condition with no unaddressed concerns.   Final Clinical Impressions(s) / ED Diagnoses   Final diagnoses:  URI with cough and congestion    ED Discharge Orders         Ordered    benzonatate (TESSALON) 100 MG capsule  3 times daily PRN     02/07/19 2359    naproxen (NAPROSYN) 500 MG tablet  2 times daily     02/07/19 2359           Antony MaduraHumes, Chavez Rosol, PA-C 02/08/19 0050    Zadie RhineWickline, Donald, MD 02/09/19 0430

## 2019-02-08 NOTE — ED Notes (Signed)
E-signature not available, verbalized understanding of instructions and prescriptions.

## 2021-06-24 ENCOUNTER — Emergency Department: Payer: Self-pay

## 2021-06-24 ENCOUNTER — Emergency Department
Admission: EM | Admit: 2021-06-24 | Discharge: 2021-06-24 | Disposition: A | Discharge status: Home / Self Care | Payer: Self-pay | Attending: Emergency Medicine | Admitting: Emergency Medicine

## 2021-06-24 ENCOUNTER — Other Ambulatory Visit: Payer: Self-pay

## 2021-06-24 DIAGNOSIS — R000 Tachycardia, unspecified: Secondary | ICD-10-CM | POA: Insufficient documentation

## 2021-06-24 DIAGNOSIS — F17290 Nicotine dependence, other tobacco product, uncomplicated: Secondary | ICD-10-CM | POA: Insufficient documentation

## 2021-06-24 DIAGNOSIS — J020 Streptococcal pharyngitis: Secondary | ICD-10-CM | POA: Insufficient documentation

## 2021-06-24 DIAGNOSIS — Z20822 Contact with and (suspected) exposure to covid-19: Secondary | ICD-10-CM | POA: Insufficient documentation

## 2021-06-24 DIAGNOSIS — R112 Nausea with vomiting, unspecified: Secondary | ICD-10-CM | POA: Insufficient documentation

## 2021-06-24 DIAGNOSIS — R1031 Right lower quadrant pain: Secondary | ICD-10-CM | POA: Insufficient documentation

## 2021-06-24 DIAGNOSIS — R509 Fever, unspecified: Secondary | ICD-10-CM

## 2021-06-24 LAB — COMPREHENSIVE METABOLIC PANEL
ALT: 17 U/L (ref 0–44)
AST: 17 U/L (ref 15–41)
Albumin: 4 g/dL (ref 3.5–5.0)
Alkaline Phosphatase: 64 U/L (ref 38–126)
Anion gap: 8 (ref 5–15)
BUN: 8 mg/dL (ref 6–20)
CO2: 25 mmol/L (ref 22–32)
Calcium: 9.1 mg/dL (ref 8.9–10.3)
Chloride: 102 mmol/L (ref 98–111)
Creatinine, Ser: 0.98 mg/dL (ref 0.61–1.24)
GFR, Estimated: >60 mL/min (ref >60)
Glucose, Bld: 106 mg/dL — ABNORMAL HIGH (ref 70–99)
Potassium: 3.3 mmol/L — ABNORMAL LOW (ref 3.5–5.1)
Sodium: 135 mmol/L (ref 135–145)
Total Bilirubin: 1 mg/dL (ref 0.3–1.2)
Total Protein: 7.3 g/dL (ref 6.5–8.1)

## 2021-06-24 LAB — CBC
HCT: 40 % (ref 39.0–52.0)
Hemoglobin: 14.1 g/dL (ref 13.0–17.0)
MCH: 32.4 pg (ref 26.0–34.0)
MCHC: 35.3 g/dL (ref 30.0–36.0)
MCV: 92 fL (ref 80.0–100.0)
Platelets: 299 10*3/uL (ref 150–400)
RBC: 4.35 MIL/uL (ref 4.22–5.81)
RDW: 12.1 % (ref 11.5–15.5)
WBC: 9.4 10*3/uL (ref 4.0–10.5)
nRBC: 0 % (ref 0.0–0.2)

## 2021-06-24 LAB — URINALYSIS, COMPLETE (UACMP) WITH MICROSCOPIC
Bacteria, UA: NONE SEEN
Bilirubin Urine: NEGATIVE
Glucose, UA: NEGATIVE mg/dL
Hgb urine dipstick: NEGATIVE
Ketones, ur: 80 mg/dL — AB
Nitrite: NEGATIVE
Protein, ur: NEGATIVE mg/dL
Specific Gravity, Urine: >1.046 — ABNORMAL HIGH (ref 1.005–1.030)
pH: 5 (ref 5.0–8.0)

## 2021-06-24 LAB — RESP PANEL BY RT-PCR (FLU A&B, COVID) ARPGX2
Influenza A by PCR: NEGATIVE
Influenza B by PCR: NEGATIVE
SARS Coronavirus 2 by RT PCR: NEGATIVE

## 2021-06-24 LAB — MONONUCLEOSIS SCREEN: Mono Screen: NEGATIVE

## 2021-06-24 LAB — LIPASE, BLOOD: Lipase: 21 U/L (ref 11–51)

## 2021-06-24 LAB — GROUP A STREP BY PCR: Group A Strep by PCR: NOT DETECTED

## 2021-06-24 MED ORDER — SODIUM CHLORIDE 0.9 % IV BOLUS
1000.0000 mL | Freq: Once | INTRAVENOUS | Status: AC
  Administered 2021-06-24: 1000 mL via INTRAVENOUS

## 2021-06-24 MED ORDER — SODIUM CHLORIDE 0.9 % IV SOLN
1.0000 g | Freq: Once | INTRAVENOUS | Status: AC
  Administered 2021-06-24: 1 g via INTRAVENOUS
  Filled 2021-06-24: qty 10

## 2021-06-24 MED ORDER — DEXAMETHASONE 1 MG/ML PO CONC
10.0000 mg | Freq: Once | ORAL | Status: AC
  Administered 2021-06-24: 10 mg via ORAL
  Filled 2021-06-24 (×2): qty 10

## 2021-06-24 MED ORDER — IOHEXOL 300 MG/ML  SOLN
100.0000 mL | Freq: Once | INTRAMUSCULAR | Status: AC | PRN
  Administered 2021-06-24: 100 mL via INTRAVENOUS
  Filled 2021-06-24: qty 100

## 2021-06-24 MED ORDER — CEFDINIR 300 MG PO CAPS: 300.0000 mg | ORAL_CAPSULE | Freq: Two times a day (BID) | ORAL | 0 refills | Status: DC

## 2021-06-24 MED ORDER — ACETAMINOPHEN 325 MG PO TABS
650.0000 mg | ORAL_TABLET | Freq: Once | ORAL | Status: AC
  Administered 2021-06-24: 650 mg via ORAL
  Filled 2021-06-24: qty 2

## 2021-06-24 MED ORDER — KETOROLAC TROMETHAMINE 30 MG/ML IJ SOLN
15.0000 mg | Freq: Once | INTRAMUSCULAR | Status: AC
  Administered 2021-06-24: 15 mg via INTRAVENOUS
  Filled 2021-06-24: qty 1

## 2021-06-24 MED ORDER — ONDANSETRON HCL 4 MG/2ML IJ SOLN
4.0000 mg | Freq: Once | INTRAMUSCULAR | Status: AC
  Administered 2021-06-24: 4 mg via INTRAVENOUS
  Filled 2021-06-24: qty 2

## 2021-06-24 MED ORDER — ONDANSETRON 4 MG PO TBDP: 4.0000 mg | ORAL_TABLET | Freq: Three times a day (TID) | ORAL | 0 refills | Status: AC | PRN

## 2021-06-24 NOTE — ED Provider Notes (Addendum)
[facility] Emergency Department Provider Note  ____________________________________________   Event Date/Time   First MD Initiated Contact with Patient 06/24/21 1412     (approximate)  I have reviewed the triage vital signs and the nursing notes.   HISTORY  Chief Complaint Abdominal Pain    HPI Wayne Short is a 25 y.o. male who is otherwise healthy comes in with right lower quadrant pain.  Patient states his symptoms started 3 days ago.  He initially started off with a sore throat.  He then developed some nausea and vomiting.  He now has developed right lower quadrant pain that is constant, nothing makes it better, nothing makes it worse.  Patient reporting fevers as well.  Denies any abdominal surgeries.  Denies any diarrhea, cough, chest pain, shortness of breath.  He does report a sore throat worse on the right side as well as some nausea as well.  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   Past Medical History:  Diagnosis Date   Heart murmur     There are no problems to display for this patient.   Past Surgical History:  Procedure Laterality Date   CARDIAC SURGERY      Prior to Admission medications   Medication Sig Start Date End Date Taking? Authorizing Provider  benzonatate (TESSALON) 100 MG capsule Take 1 capsule (100 mg total) by mouth 3 (three) times daily as needed for cough. 02/07/19   Antony Madura, PA-C  cyclobenzaprine (FLEXERIL) 10 MG tablet Take 1 tablet (10 mg total) by mouth 2 (two) times daily as needed for muscle spasms. Patient not taking: Reported on 02/10/2018 09/28/17   Kellie Shropshire, PA-C  dicyclomine (BENTYL) 20 MG tablet Take 1 tablet (20 mg total) by mouth 2 (two) times daily. 02/10/18   Cardama, Amadeo Garnet, MD  doxycycline (VIBRAMYCIN) 100 MG capsule Take 1 capsule (100 mg total) by mouth 2 (two) times daily. 05/23/18   Hedges, Tinnie Gens, PA-C  famotidine (PEPCID) 20 MG  tablet Take 1 tablet (20 mg total) by mouth 2 (two) times daily. 11/11/18   Fawze, Mina A, PA-C  metoCLOPramide (REGLAN) 10 MG tablet Take 1 tablet (10 mg total) by mouth every 8 (eight) hours as needed for nausea or vomiting. 11/11/18   Michela Pitcher A, PA-C  naproxen (NAPROSYN) 500 MG tablet Take 1 tablet (500 mg total) by mouth 2 (two) times daily. 02/07/19   Antony Madura, PA-C  omeprazole (PRILOSEC) 20 MG capsule Take 1 capsule (20 mg total) by mouth daily for 14 days. 09/04/18 09/18/18  Aviva Kluver B, PA-C  polyethylene glycol powder (MIRALAX) powder Take 3 capfuls for day 1 and 2. On day 3 start taking 1 capful 3 times a day. Slowly cut back as needed until you have normal bowel movements 02/10/18   Cardama, Amadeo Garnet, MD  promethazine (PHENERGAN) 25 MG tablet Take 1 tablet (25 mg total) by mouth every 6 (six) hours as needed for nausea or vomiting. Patient not taking: Reported on 02/10/2018 07/19/16   Charlestine Night, PA-C  sucralfate (CARAFATE) 1 g tablet Take 1 tablet (1 g total) by mouth 4 (four) times daily -  with meals and at bedtime for 14 days. 09/04/18 09/18/18  Aviva Kluver B, PA-C    Allergies Patient has no known allergies.  No family history on file.  Social History Social History   Tobacco Use   Smoking status: Every Day    Pack years:  0.00    Types: E-cigarettes   Smokeless tobacco: Never  Vaping Use   Vaping Use: Never used  Substance Use Topics   Alcohol use: Not Currently   Drug use: Yes    Types: Marijuana    Comment: saturday last used      Review of Systems Constitutional: Positive fever Eyes: No visual changes. ENT: Positive sore throat Cardiovascular: Denies chest pain. Respiratory: Denies shortness of breath. Gastrointestinal: Abdominal pain, nausea, vomiting Genitourinary: Negative for dysuria. Musculoskeletal: Negative for back pain. Skin: Negative for rash. Neurological: Negative for headaches, focal weakness or numbness. All other ROS  negative ____________________________________________   PHYSICAL EXAM:  VITAL SIGNS: ED Triage Vitals  Enc Vitals Group     BP 06/24/21 1230 126/75     Pulse Rate 06/24/21 1230 (!) 104     Resp 06/24/21 1230 17     Temp 06/24/21 1230 (!) 103 F (39.4 C)     Temp Source 06/24/21 1230 Oral     SpO2 06/24/21 1230 97 %     Weight --      Height --      Head Circumference --      Peak Flow --      Pain Score 06/24/21 1227 8     Pain Loc --      Pain Edu? --      Excl. in GC? --     Constitutional: Alert and oriented. Well appearing and in no acute distress. Eyes: Conjunctivae are normal. EOMI. Head: Atraumatic. Nose: No congestion/rhinnorhea. Mouth/Throat: Mucous membranes are moist.  Tonsils are equal and side with some mild erythema noted to them.  Does have some pus noted on the right tonsil uvula is midline. Neck: No stridor. Trachea Midline. FROM Cardiovascular: Normal rate, regular rhythm. Grossly normal heart sounds.  Good peripheral circulation. Respiratory: Normal respiratory effort.  No retractions. Lungs CTAB. Gastrointestinal: Tender in the right lower quadrant.  No distention. No abdominal bruits.  Musculoskeletal: No lower extremity tenderness nor edema.  No joint effusions. Neurologic:  Normal speech and language. No gross focal neurologic deficits are appreciated.  Skin:  Skin is warm, dry and intact. No rash noted. Psychiatric: Mood and affect are normal. Speech and behavior are normal. GU: Deferred   ____________________________________________   LABS (all labs ordered are listed, but only abnormal results are displayed)   ____________________________________________   RADIOLOGY I, Concha Se, personally viewed and evaluated these images (plain radiographs) as part of my medical decision making, as well as reviewing the written report by the radiologist.  ED MD interpretation:  no PNA   Official radiology report(s): DG Chest 2 View  Result  Date: 06/24/2021 CLINICAL DATA:  Cough and fevers EXAM: CHEST - 2 VIEW COMPARISON:  02/07/2019 FINDINGS: The heart size and mediastinal contours are within normal limits. Both lungs are clear. The visualized skeletal structures are unremarkable. IMPRESSION: No active cardiopulmonary disease. Electronically Signed   By: Alcide Clever M.D.   On: 06/24/2021 16:56   CT ABDOMEN PELVIS W CONTRAST  Result Date: 06/24/2021 CLINICAL DATA:  Right lower quadrant abdominal pain, fever, emesis since Sunday EXAM: CT ABDOMEN AND PELVIS WITH CONTRAST TECHNIQUE: Multidetector CT imaging of the abdomen and pelvis was performed using the standard protocol following bolus administration of intravenous contrast. CONTRAST:  OMNIPAQUE IOHEXOL 300 MG/ML  SOLN COMPARISON:  07/19/2016 FINDINGS: Lower chest: Lung bases are clear. Normal heart size. No pericardial effusion. Hepatobiliary: No worrisome focal liver abnormality is seen. Normal gallbladder.  No visible calcified gallstones. No biliary ductal dilatation. Pancreas: No pancreatic ductal dilatation or surrounding inflammatory changes. Spleen: Normal in size. No concerning splenic lesions. Adrenals/Urinary Tract: Normal adrenal glands. Kidneys are normally located with symmetric enhancement. Scattered subcentimeter hypertension foci in both kidneys too small to fully characterize on CT imaging but statistically likely benign. No suspicious renal lesion, urolithiasis or hydronephrosis. Urinary bladder is unremarkable for the degree of distention Stomach/Bowel: Distal esophagus, stomach and duodenal sweep are unremarkable. No small bowel wall thickening or dilatation. No evidence of obstruction. Noninflamed appendix in the right lower quadrant. No colonic dilatation or wall thickening. Vascular/Lymphatic: No significant vascular findings are present. No enlarged abdominal or pelvic lymph nodes. Reproductive: The prostate and seminal vesicles are unremarkable. No acute abnormality  of the external genitalia. Other: No abdominopelvic free fluid or free gas. No bowel containing hernias. Musculoskeletal: No acute osseous abnormality or suspicious osseous lesion. IMPRESSION: No acute CT abnormality to provide cause patient's abdominal symptoms. Specifically, the appendix is normal. Electronically Signed   By: Kreg ShropshirePrice  DeHay M.D.   On: 06/24/2021 15:27    ____________________________________________   PROCEDURES  Procedure(s) performed (including Critical Care):  Procedures   ____________________________________________   INITIAL IMPRESSION / ASSESSMENT AND PLAN / ED COURSE  Peggye LeyCyrus Cronce was evaluated in Emergency Department on 06/24/2021 for the symptoms described in the history of present illness. He was evaluated in the context of the global COVID-19 pandemic, which necessitated consideration that the patient might be at risk for infection with the SARS-CoV-2 virus that causes COVID-19. Institutional protocols and algorithms that pertain to the evaluation of patients at risk for COVID-19 are in a state of rapid change based on information released by regulatory bodies including the CDC and federal and state organizations. These policies and algorithms were followed during the patient's care in the ED.    Patient is a well-appearing 25 year old who comes in febrile and tachycardic secondary to sore throat, abdominal pain.  Labs ordered to evaluate for flu, COVID, strep, mono.  Given the right lower quadrant tenderness will get CT imaging to evaluate for appendicitis.  Patient was given Tylenol, IV fluids and Zofran  Labs are reassuring with normal white count.  His urine has a little bit of WBCs but he denies any symptoms of UTI.  Strep test, COVID test are negative and CT scan are negative.  On repeat evaluation his vital signs are stable.  Patient does have pus noted on the right tonsil and he does report pain mostly into his throat which he thinks is causing his other  symptoms.  At this time I think I would like to treat him prophylactically for concern for strep with negative strep test.  Patient does deny any oral sex with concerns for G/C.  Denies any tick bites to suggest [facility] fever   we also discussed the possibility of endocarditis or an infection in his bloodstream.  He does have potentially a very slight murmur on examination but he has a history of murmur requiring cardiac surgeries are not sure if this is baseline for him.  He does not have any primary care notes to compare to or cardiology notes.  He is adamant that he has not used IV drugs.  I did discuss with patient that if he did have IV drugs that would put him at higher risk for 1 of these things and that I would recommend he be admitted to the hospital.  He states that he definitely does not  use any sort of IV drug.  Therefore patient is agreeable to at least getting blood cultures and if positive he will return to the ER.  I did confirm his number in MyChart and he stated that he would keep it on him.  In the meantime we will treat him with cefdinir to have broader coverage of potential strep throat/uti/pna.   On my exam I do not see any signs of peritonsillar abscess and I have low suspicion for retropharyngeal abscess given full range of motion of neck.  At this time patient would like to be discharged home and feels comfortable with this   7:17 PM patient does not think he can get to the pharmacy tonight so  will give a dose of IV ceftriaxone given blood cultures will be pending and to cover him for antibiotics until tomorrow  7:30 PM reevaluated patient.  His abdomen is soft and nontender at this time.  Again we discussed admission for observation for his fever versus going home.  Patient states that he is feeling much better and is only that his throat that is hurting and we already covering him on antibiotics for possible strep throat so I think that this is reasonable but he  understands the importance of coming back if his symptoms are worsening or his blood cultures and being positive  I discussed the provisional nature of ED diagnosis, the treatment so far, the ongoing plan of care, follow up appointments and return precautions with the patient and any family or support people present. They expressed understanding and agreed with the plan, discharged home.                 ____________________________________________   FINAL CLINICAL IMPRESSION(S) / ED DIAGNOSES   Final diagnoses:  Strep pharyngitis  Fever, unspecified fever cause      MEDICATIONS GIVEN DURING THIS VISIT:  Medications  dexamethasone (DECADRON) 1 MG/ML solution 10 mg (has no administration in time range)  ketorolac (TORADOL) 30 MG/ML injection 15 mg (has no administration in time range)  acetaminophen (TYLENOL) tablet 650 mg (650 mg Oral Given 06/24/21 1231)  sodium chloride 0.9 % bolus 1,000 mL (0 mLs Intravenous Stopped 06/24/21 1551)  ondansetron (ZOFRAN) injection 4 mg (4 mg Intravenous Given 06/24/21 1450)  iohexol (OMNIPAQUE) 300 MG/ML solution 100 mL (100 mLs Intravenous Contrast Given 06/24/21 1502)     ED Discharge Orders          Ordered    cefdinir (OMNICEF) 300 MG capsule  2 times daily        06/24/21 1843             Note:  This document was prepared using Dragon voice recognition software and may include unintentional dictation errors.    Concha Se, MD 06/24/21 Prentice Docker    Concha Se, MD 06/24/21 1918    Concha Se, MD 06/24/21 [phone]

## 2021-06-24 NOTE — Discharge Instructions (Addendum)
We are covering you for potential strep throat but your blood cultures are pending.  These are looking for an infection in your blood.  If they are positive we will call you back to be readmitted to the hospital.  If you develop worsening symptoms prior to then, unable to tolerate drinking or any other concerns please come to the ER earlier

## 2021-06-24 NOTE — ED Notes (Signed)
Not able to urinate yet

## 2021-06-24 NOTE — ED Triage Notes (Signed)
Pt comes with c/o RLQ pain, fever, vomiting since Sunday.

## 2021-06-25 ENCOUNTER — Telehealth: Payer: Self-pay

## 2021-06-25 ENCOUNTER — Emergency Department: Payer: Self-pay

## 2021-06-25 ENCOUNTER — Other Ambulatory Visit: Payer: Self-pay

## 2021-06-25 ENCOUNTER — Inpatient Hospital Stay
Admission: EM | Admit: 2021-06-25 | Discharge: 2021-06-26 | DRG: 153 | Disposition: A | Discharge status: Home / Self Care | Payer: Self-pay | Attending: Family Medicine | Admitting: Family Medicine

## 2021-06-25 DIAGNOSIS — R3129 Other microscopic hematuria: Secondary | ICD-10-CM | POA: Diagnosis present

## 2021-06-25 DIAGNOSIS — R7881 Bacteremia: Secondary | ICD-10-CM | POA: Diagnosis present

## 2021-06-25 DIAGNOSIS — R011 Cardiac murmur, unspecified: Secondary | ICD-10-CM | POA: Diagnosis present

## 2021-06-25 DIAGNOSIS — Z1629 Resistance to other single specified antibiotic: Secondary | ICD-10-CM

## 2021-06-25 DIAGNOSIS — R112 Nausea with vomiting, unspecified: Secondary | ICD-10-CM | POA: Diagnosis present

## 2021-06-25 DIAGNOSIS — Z8616 Personal history of COVID-19: Secondary | ICD-10-CM

## 2021-06-25 DIAGNOSIS — B957 Other staphylococcus as the cause of diseases classified elsewhere: Secondary | ICD-10-CM

## 2021-06-25 DIAGNOSIS — E876 Hypokalemia: Secondary | ICD-10-CM | POA: Diagnosis present

## 2021-06-25 DIAGNOSIS — R8281 Pyuria: Secondary | ICD-10-CM | POA: Diagnosis present

## 2021-06-25 DIAGNOSIS — R509 Fever, unspecified: Secondary | ICD-10-CM | POA: Diagnosis present

## 2021-06-25 DIAGNOSIS — A419 Sepsis, unspecified organism: Secondary | ICD-10-CM | POA: Diagnosis present

## 2021-06-25 DIAGNOSIS — Z20822 Contact with and (suspected) exposure to covid-19: Secondary | ICD-10-CM | POA: Diagnosis present

## 2021-06-25 DIAGNOSIS — Z79899 Other long term (current) drug therapy: Secondary | ICD-10-CM

## 2021-06-25 DIAGNOSIS — F1290 Cannabis use, unspecified, uncomplicated: Secondary | ICD-10-CM | POA: Diagnosis present

## 2021-06-25 DIAGNOSIS — F17290 Nicotine dependence, other tobacco product, uncomplicated: Secondary | ICD-10-CM | POA: Diagnosis present

## 2021-06-25 DIAGNOSIS — J039 Acute tonsillitis, unspecified: Principal | ICD-10-CM | POA: Diagnosis present

## 2021-06-25 LAB — CBC WITH DIFFERENTIAL/PLATELET
Abs Immature Granulocytes: 0.05 10*3/uL (ref 0.00–0.07)
Basophils Absolute: 0 10*3/uL (ref 0.0–0.1)
Basophils Relative: 0 %
Eosinophils Absolute: 0 10*3/uL (ref 0.0–0.5)
Eosinophils Relative: 0 %
HCT: 39.2 % (ref 39.0–52.0)
Hemoglobin: 14.1 g/dL (ref 13.0–17.0)
Immature Granulocytes: 1 %
Lymphocytes Relative: 11 %
Lymphs Abs: 1.2 10*3/uL (ref 0.7–4.0)
MCH: 32.3 pg (ref 26.0–34.0)
MCHC: 36 g/dL (ref 30.0–36.0)
MCV: 89.9 fL (ref 80.0–100.0)
Monocytes Absolute: 1.1 10*3/uL — ABNORMAL HIGH (ref 0.1–1.0)
Monocytes Relative: 10 %
Neutro Abs: 8.8 10*3/uL — ABNORMAL HIGH (ref 1.7–7.7)
Neutrophils Relative %: 78 %
Platelets: 296 10*3/uL (ref 150–400)
RBC: 4.36 MIL/uL (ref 4.22–5.81)
RDW: 11.9 % (ref 11.5–15.5)
WBC: 11.1 10*3/uL — ABNORMAL HIGH (ref 4.0–10.5)
nRBC: 0 % (ref 0.0–0.2)

## 2021-06-25 LAB — BLOOD CULTURE ID PANEL (REFLEXED) - BCID2

## 2021-06-25 LAB — COMPREHENSIVE METABOLIC PANEL
ALT: 14 U/L (ref 0–44)
AST: 18 U/L (ref 15–41)
Albumin: 3.7 g/dL (ref 3.5–5.0)
Alkaline Phosphatase: 59 U/L (ref 38–126)
Anion gap: 6 (ref 5–15)
BUN: 8 mg/dL (ref 6–20)
CO2: 26 mmol/L (ref 22–32)
Calcium: 9 mg/dL (ref 8.9–10.3)
Chloride: 103 mmol/L (ref 98–111)
Creatinine, Ser: 0.72 mg/dL (ref 0.61–1.24)
GFR, Estimated: >60 mL/min (ref >60)
Glucose, Bld: 151 mg/dL — ABNORMAL HIGH (ref 70–99)
Potassium: 3.3 mmol/L — ABNORMAL LOW (ref 3.5–5.1)
Sodium: 135 mmol/L (ref 135–145)
Total Bilirubin: 0.7 mg/dL (ref 0.3–1.2)
Total Protein: 7.5 g/dL (ref 6.5–8.1)

## 2021-06-25 LAB — LACTIC ACID, PLASMA: Lactic Acid, Venous: 0.9 mmol/L (ref 0.5–1.9)

## 2021-06-25 LAB — PROTIME-INR
INR: 1 (ref 0.8–1.2)
Prothrombin Time: 13.4 seconds (ref 11.4–15.2)

## 2021-06-25 LAB — CHLAMYDIA/NGC RT PCR (ARMC ONLY)
Chlamydia Tr: NOT DETECTED
N gonorrhoeae: NOT DETECTED

## 2021-06-25 LAB — PROCALCITONIN: Procalcitonin: <0.1 ng/mL

## 2021-06-25 LAB — HIV ANTIBODY (ROUTINE TESTING W REFLEX): HIV Screen 4th Generation wRfx: NONREACTIVE

## 2021-06-25 MED ORDER — SODIUM CHLORIDE 0.9 % IV SOLN
3.0000 g | Freq: Four times a day (QID) | INTRAVENOUS | Status: DC
  Administered 2021-06-25 – 2021-06-26 (×4): 3 g via INTRAVENOUS
  Filled 2021-06-25 (×8): qty 8

## 2021-06-25 MED ORDER — SODIUM CHLORIDE 0.9 % IV SOLN
2.0000 g | INTRAVENOUS | Status: DC
  Filled 2021-06-25 (×2): qty 20

## 2021-06-25 MED ORDER — ACETAMINOPHEN 650 MG RE SUPP: 650.0000 mg | Freq: Four times a day (QID) | RECTAL | Status: DC | PRN

## 2021-06-25 MED ORDER — ONDANSETRON HCL 4 MG/2ML IJ SOLN
4.0000 mg | Freq: Four times a day (QID) | INTRAMUSCULAR | Status: DC | PRN
  Administered 2021-06-26: 4 mg via INTRAVENOUS
  Filled 2021-06-25: qty 2

## 2021-06-25 MED ORDER — ONDANSETRON HCL 4 MG PO TABS: 4.0000 mg | ORAL_TABLET | Freq: Four times a day (QID) | ORAL | Status: DC | PRN

## 2021-06-25 MED ORDER — IOHEXOL 300 MG/ML  SOLN
75.0000 mL | Freq: Once | INTRAMUSCULAR | Status: AC | PRN
  Administered 2021-06-25: 75 mL via INTRAVENOUS
  Filled 2021-06-25: qty 75

## 2021-06-25 MED ORDER — ACETAMINOPHEN 325 MG PO TABS
650.0000 mg | ORAL_TABLET | Freq: Four times a day (QID) | ORAL | Status: DC | PRN
  Administered 2021-06-25 – 2021-06-26 (×2): 650 mg via ORAL
  Filled 2021-06-25 (×2): qty 2

## 2021-06-25 MED ORDER — LACTATED RINGERS IV SOLN: INTRAVENOUS | Status: DC

## 2021-06-25 MED ORDER — ENOXAPARIN SODIUM 40 MG/0.4ML IJ SOSY
40.0000 mg | PREFILLED_SYRINGE | INTRAMUSCULAR | Status: DC
  Administered 2021-06-25: 40 mg via SUBCUTANEOUS
  Filled 2021-06-25: qty 0.4

## 2021-06-25 MED ORDER — POTASSIUM CHLORIDE CRYS ER 20 MEQ PO TBCR
30.0000 meq | EXTENDED_RELEASE_TABLET | Freq: Once | ORAL | Status: AC
  Administered 2021-06-25: 30 meq via ORAL
  Filled 2021-06-25: qty 2

## 2021-06-25 MED ORDER — SODIUM CHLORIDE 0.9 % IV SOLN: 1.0000 g | Freq: Once | INTRAVENOUS | Status: DC

## 2021-06-25 NOTE — ED Notes (Signed)
Patient to CT at this time

## 2021-06-25 NOTE — H&P (Signed)
History and Physical   Tylen Short WNU:[id number] DOB: 1996-09-12 DOA: 06/25/2021  PCP: Wayne Short, No Pcp Per (Inactive)  Outpatient Specialists: none Wayne Short coming from: home  I have personally briefly reviewed Wayne Short's old medical records in [place].  Chief Concern: Bacteremia  HPI: Wayne Short is a 25 y.o. male with no previous medical history significant presents to the emergency department for chief concerns of bacteremia.  On 06/24/2021, Wayne Short presented to the emergency department for chief concerns of nausea, vomiting, abdominal pain that started on Monday morning, since waking up on 06/23/21.  He reports persistent and continuous vomiting, yellow in vomitus and trace of blood that happened.  He reports no other acquaintance that has the symptoms.  He reports that the symptoms resolved on evening of 6/28.  In the emergency department at that time, group A strep PCR, mono, was done and was found to be negative.  COVID PCR was negative, UA was found to have trace leukocytosis.  Wayne Short was given 1 dose of ceftriaxone.  Blood cultures x2 were collected.  His symptoms improved and he was discharged home from the emergency department.  On day of admission, his father received a phone call from [facility] regarding positive blood cultures and advised that Wayne Short represent to the emergency department for further evaluation.  At bedside, Wayne Short denies chest pain, shortness of breath, abdominal pain, dysuria, diarrhea.  He does not have any wounds.  And he denies implanted foreign device on his person.  He also reports that the vomiting stopped since evening of 06/24/21.   He endorses sore throat that started on 06/24/21.   He endorses t max of 105.8 on 06/24/2021.Marland Kitchen   Social history: He lives with his roommate. He denies IV drugs. He infrequently etoh, last drink was 7 days ago, and it was one shot of liquor. He smokes THC, 06/24/21, smokes 2-3x times per day.  He vapes with Hives daily.  Vaccination history: He is vaccinated with one dose of Moderna and covid infection in July 2021.   ROS: Constitutional: no weight change, + fever ENT/Mouth: + sore throat, no rhinorrhea Eyes: no eye pain, no vision changes Cardiovascular: no chest pain, no dyspnea,  no edema, no palpitations Respiratory: no cough, no sputum, no wheezing Gastrointestinal: + nausea, + vomiting, no diarrhea, no constipation Genitourinary: no urinary incontinence, no dysuria, no hematuria Musculoskeletal: no arthralgias, no myalgias Skin: no skin lesions, no pruritus, Neuro: + weakness, no loss of consciousness, no syncope Psych: no anxiety, no depression, + decrease appetite Heme/Lymph: no bruising, no bleeding  ED Course: Discussed with ED provider, Wayne Short requiring hospitalization for gram-positive bacteremia.  Blood cultures collected on 06/24/2021, were positive in both sets.  Wayne Short also had fever and mild leukocytosis, therefore meeting sepsis criteria.  ED provider ordered ceftriaxone 1 g IV.  Labs in the emergency department was remarkable for sodium 135, potassium 3.3, BUN 8, serum creatinine of 0.72, WBC 11.1, hemoglobin 14.1, platelets 296, lactic acid is 0.9, EGFR greater than 60.  UA on 06/24/2021 which showed trace leukocytes.  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, he states that when his blood cultures were drawn, they were drawn from the left arm and in the same spot - Wayne Short even noted that he pointed this out to the person during the blood draw - Ceftriaxone 2 g IV  ordered - Infectious disease specialist has been consulted and recommends Unasyn IV, states she will put in the orders - Infectious disease also recommends chlamydia and gonorrhea PCR via throat specimen - Repeat blood cultures x2  has been ordered - Complete echo ordered  Tonsillitis-Unasyn per infectious disease  Pyuria-as Wayne Short does not have urinary symptoms, does not meet criteria for urinary tract infection  Hypokalemia-suspect secondary to GI losses - Potassium chloride 30 mill equivalent p.o. once - Checking magnesium in a.m. - BMP in the a.m.  Chart reviewed.   DVT prophylaxis: Enoxaparin 40 mg subcutaneous every 24 hours Code Status: Full code Diet: Regular Family Communication: No Disposition Plan: Pending clinical course Consults called: Infectious disease Admission status: MedSurg, observation, no telemetry  Past Medical History:  Diagnosis Date   Heart murmur    Past Surgical History:  Procedure Laterality Date   CARDIAC SURGERY     Social History:  reports that he has been smoking e-cigarettes. He has never used smokeless tobacco. He reports previous alcohol use. He reports current drug use. Drug: Marijuana.  No Known Allergies No family history on file. Family history: Family history reviewed and not pertinent  Prior to Admission medications   Medication Sig Start Date End Date Taking? Authorizing Provider  benzonatate (TESSALON) 100 MG capsule Take 1 capsule (100 mg total) by mouth 3 (three) times daily as needed for cough. 02/07/19   Antonietta Breach, PA-C  cefdinir (OMNICEF) 300 MG capsule Take 1 capsule (300 mg total) by mouth 2 (two) times daily for 5 days. 06/24/21 06/29/21  Vanessa Metzger, MD  cyclobenzaprine (FLEXERIL) 10 MG tablet Take 1 tablet (10 mg total) by mouth 2 (two) times daily as needed for muscle spasms. Wayne Short not taking: Reported on 02/10/2018 09/28/17   Glyn Ade, PA-C  dicyclomine (BENTYL) 20 MG tablet Take 1 tablet (20 mg total) by mouth 2 (two) times daily. 02/10/18   Cardama, Grayce Sessions, MD  doxycycline (VIBRAMYCIN) 100 MG capsule Take 1 capsule (100 mg total) by mouth 2 (two) times daily. 05/23/18   Hedges, Dellis Filbert, PA-C  famotidine (PEPCID) 20 MG tablet  Take 1 tablet (20 mg total) by mouth 2 (two) times daily. 11/11/18   Fawze, Mina A, PA-C  metoCLOPramide (REGLAN) 10 MG tablet Take 1 tablet (10 mg total) by mouth every 8 (eight) hours as needed for nausea or vomiting. 11/11/18   Rodell Perna A, PA-C  naproxen (NAPROSYN) 500 MG tablet Take 1 tablet (500 mg total) by mouth 2 (two) times daily. 02/07/19   Antonietta Breach, PA-C  omeprazole (PRILOSEC) 20 MG capsule Take 1 capsule (20 mg total) by mouth daily for 14 days. 09/04/18 09/18/18  Langston Masker B, PA-C  ondansetron (ZOFRAN ODT) 4 MG disintegrating tablet Take 1 tablet (4 mg total) by mouth every 8 (eight) hours as needed for nausea or vomiting. 06/24/21   Vanessa Box, MD  polyethylene glycol powder (MIRALAX) powder Take 3 capfuls for day 1 and 2. On day 3 start taking 1 capful 3 times a day. Slowly cut back as needed until you have normal bowel movements 02/10/18   Cardama, Grayce Sessions, MD  promethazine (PHENERGAN) 25 MG tablet Take 1 tablet (25 mg total) by mouth every 6 (six) hours as needed for nausea or vomiting. Wayne Short not taking: Reported on 02/10/2018 07/19/16   Dalia Heading, PA-C  sucralfate (CARAFATE) 1 g tablet Take 1 tablet (1 g total) by mouth 4 (four) times daily -  with meals and at  bedtime for 14 days. 09/04/18 09/18/18  Albesa Seen, PA-C   Physical Exam: Vitals:   06/25/21 1202 06/25/21 1207  BP: 132/82   Pulse: 92   Resp: 17   Temp: 98.6 F (37 C)   TempSrc: Oral   SpO2: 98%   Weight: 95.3 kg 95 kg  Height: $Remove'6\' 3"'LvBZyMu$  (1.905 m) $RemoveB'6\' 3"'MNreWsQx$  (1.905 m)   Constitutional: appears age-appropriate, NAD, calm, comfortable Eyes: PERRL, lids and conjunctivae normal ENMT: Mucous membranes are moist. Posterior pharynx clear of any exudate or lesions. Age-appropriate dentition. Hearing appropriate Neck: normal, supple, no masses, no thyromegaly Respiratory: clear to auscultation bilaterally, no wheezing, no crackles. Normal respiratory effort. No accessory muscle use.  Cardiovascular:  Regular rate and rhythm, no murmurs / rubs / gallops. No extremity edema. 2+ pedal pulses. No carotid bruits.  Abdomen: no tenderness, no masses palpated, no hepatosplenomegaly. Bowel sounds positive.  Musculoskeletal: no clubbing / cyanosis. No joint deformity upper and lower extremities. Good ROM, no contractures, no atrophy. Normal muscle tone.  Skin: no rashes, lesions, ulcers. No induration Neurologic: Sensation intact. Strength 5/5 in all 4.  Psychiatric: Normal judgment and insight. Alert and oriented x 3. Normal mood.   EKG: Not indicated  Chest x-ray on Admission: I personally reviewed and I agree with radiologist reading as below.  DG Chest 2 View  Result Date: 06/25/2021 CLINICAL DATA:  Pain with fever and sore throat EXAM: CHEST - 2 VIEW COMPARISON:  June 24, 2021 FINDINGS: The lungs are clear. Heart size and pulmonary vascularity are normal. No adenopathy. No pneumothorax. No bone lesions. IMPRESSION: Lungs clear.  Cardiac silhouette normal. Electronically Signed   By: Lowella Grip III M.D.   On: 06/25/2021 12:58   DG Chest 2 View  Result Date: 06/24/2021 CLINICAL DATA:  Cough and fevers EXAM: CHEST - 2 VIEW COMPARISON:  02/07/2019 FINDINGS: The heart size and mediastinal contours are within normal limits. Both lungs are clear. The visualized skeletal structures are unremarkable. IMPRESSION: No active cardiopulmonary disease. Electronically Signed   By: Inez Catalina M.D.   On: 06/24/2021 16:56   CT Soft Tissue Neck W Contrast  Result Date: 06/25/2021 CLINICAL DATA:  Sore throat, positive blood cultures EXAM: CT NECK WITH CONTRAST TECHNIQUE: Multidetector CT imaging of the neck was performed using the standard protocol following the bolus administration of intravenous contrast. CONTRAST:  73mL OMNIPAQUE IOHEXOL 300 MG/ML  SOLN COMPARISON:  None. FINDINGS: Pharynx and larynx: Prominence of the palatine tonsils, right greater than left, with heterogeneity but no peritonsillar  abscess. Pharynx is otherwise unremarkable. The airway is patent. Salivary glands: Parotid and submandibular glands are unremarkable. Thyroid: Head Lymph nodes: Asymmetric enlarged 2.2 cm right level 2 node is likely reactive. Vascular: Major neck vessels are patent. Limited intracranial: No abnormal enhancement. Visualized orbits: Unremarkable. Mastoids and visualized paranasal sinuses: Patchy mucosal thickening. Mastoid air cells are clear. 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 No acute abnormality. Upper chest: Included upper lungs are clear. Other: None. IMPRESSION: Palatine tonsillitis without abscess. Reactive right level 2 adenopathy. Electronically Signed   By: Macy Mis M.D.   On: 06/25/2021 14:29   CT ABDOMEN PELVIS W CONTRAST  Result Date: 06/24/2021 CLINICAL DATA:  Right lower quadrant abdominal pain, fever, emesis since Sunday EXAM: CT ABDOMEN AND PELVIS WITH CONTRAST TECHNIQUE: Multidetector CT imaging of the abdomen and pelvis was performed using the standard protocol following bolus administration of  intravenous contrast. CONTRAST:  158mL OMNIPAQUE IOHEXOL 300 MG/ML  SOLN COMPARISON:  07/19/2016 FINDINGS: Lower chest: Lung bases are clear. Normal heart size. No pericardial effusion. Hepatobiliary: No worrisome focal liver abnormality is seen. Normal gallbladder. No visible calcified gallstones. No biliary ductal dilatation. Pancreas: No pancreatic ductal dilatation or surrounding inflammatory changes. Spleen: Normal in size. No concerning splenic lesions. Adrenals/Urinary Tract: Normal adrenal glands. Kidneys are normally located with symmetric enhancement. Scattered subcentimeter hypertension foci in both kidneys too small to fully characterize on CT imaging but statistically likely benign. No suspicious renal lesion, urolithiasis or hydronephrosis. Urinary bladder is unremarkable for the  degree of distention Stomach/Bowel: Distal esophagus, stomach and duodenal sweep are unremarkable. No small bowel wall thickening or dilatation. No evidence of obstruction. Noninflamed appendix in the right lower quadrant. No colonic dilatation or wall thickening. Vascular/Lymphatic: No significant vascular findings are present. No enlarged abdominal or pelvic lymph nodes. Reproductive: The prostate and seminal vesicles are unremarkable. No acute abnormality of the external genitalia. Other: No abdominopelvic free fluid or free gas. No bowel containing hernias. Musculoskeletal: No acute osseous abnormality or suspicious osseous lesion. IMPRESSION: No acute CT abnormality to provide cause Wayne Short's abdominal symptoms. Specifically, the appendix is normal. Electronically Signed   By: Lovena Le M.D.   On: 06/24/2021 15:27    Labs on Admission: I have personally reviewed following labs  CBC: Recent Labs  Lab 06/24/21 1229 06/25/21 1204  WBC 9.4 11.1*  NEUTROABS  --  8.8*  HGB 14.1 14.1  HCT 40.0 39.2  MCV 92.0 89.9  PLT 299 081   Basic Metabolic Panel: Recent Labs  Lab 06/24/21 1229 06/25/21 1204  NA 135 135  K 3.3* 3.3*  CL 102 103  CO2 25 26  GLUCOSE 106* 151*  BUN 8 8  CREATININE 0.98 0.72  CALCIUM 9.1 9.0   GFR: Estimated Creatinine Clearance: 167.2 mL/min (by C-G formula based on SCr of 0.72 mg/dL). Liver Function Tests: Recent Labs  Lab 06/24/21 1229 06/25/21 1204  AST 17 18  ALT 17 14  ALKPHOS 64 59  BILITOT 1.0 0.7  PROT 7.3 7.5  ALBUMIN 4.0 3.7   Recent Labs  Lab 06/24/21 1229  LIPASE 21   Coagulation Profile: Recent Labs  Lab 06/25/21 1204  INR 1.0   Urine analysis:    Component Value Date/Time   COLORURINE YELLOW (A) 06/24/2021 1550   APPEARANCEUR CLEAR (A) 06/24/2021 1550   LABSPEC >1.046 (H) 06/24/2021 1550   PHURINE 5.0 06/24/2021 1550   GLUCOSEU NEGATIVE 06/24/2021 1550   HGBUR NEGATIVE 06/24/2021 1550   BILIRUBINUR NEGATIVE 06/24/2021  1550   KETONESUR 80 (A) 06/24/2021 1550   PROTEINUR NEGATIVE 06/24/2021 1550   NITRITE NEGATIVE 06/24/2021 1550   LEUKOCYTESUR TRACE (A) 06/24/2021 1550   Dr. Tobie Poet [facility]  If 7PM-7AM, please contact overnight-coverage provider If 7AM-7PM, please contact day coverage provider [email or web address]  06/25/2021, 5:06 PM

## 2021-06-25 NOTE — ED Notes (Signed)
Pt resting in bed, locked and low, call light in reach, NAD.

## 2021-06-25 NOTE — ED Notes (Signed)
Received call from Lab with pt's + BC results. MD Paduchowski informed and reviewed pt's chart. Per MD Pad pt is to be called and informed of results and to come back to ED for admission. Attempted to call the only number listed in pt's chart which is his father. Left message for pt to please come back to ED or call asap.  MD informed.

## 2021-06-25 NOTE — ED Notes (Signed)
Lab at bedside at this time.  

## 2021-06-25 NOTE — ED Provider Notes (Signed)
[facility] Emergency Department Provider Note  Time seen: 1:57 PM  I have reviewed the triage vital signs and the nursing notes.   HISTORY  Chief Complaint abnormal labs   HPI Wayne Short is a 25 y.o. male with no significant past medical history who presented to the emergency department yesterday for fever sore throat, nausea vomiting, body aches abdominal pain, now with positive blood cultures.  According to the patient for the past 2 to 3 days he has not been feeling well which she describes as generalized weakness fatigue fever as high as 103 yesterday along with chills nausea vomiting abdominal pain and sore throat.  Patient was seen in the emergency department yesterday had a significant work-up completed ultimately discharged home.  Blood cultures today resulted positive for both aerobic and anaerobic bottles, and he was called to return to the emergency department for evaluation.  Patient states he is no longer vomiting in fact he feels hungry but states continued chills and sore throat especially on the right side.   Past Medical History:  Diagnosis Date   Heart murmur     There are no problems to display for this patient.   Past Surgical History:  Procedure Laterality Date   CARDIAC SURGERY      Prior to Admission medications   Medication Sig Start Date End Date Taking? Authorizing Provider  benzonatate (TESSALON) 100 MG capsule Take 1 capsule (100 mg total) by mouth 3 (three) times daily as needed for cough. 02/07/19   Antony Madura, PA-C  cefdinir (OMNICEF) 300 MG capsule Take 1 capsule (300 mg total) by mouth 2 (two) times daily for 5 days. 06/24/21 06/29/21  Concha Se, MD  cyclobenzaprine (FLEXERIL) 10 MG tablet Take 1 tablet (10 mg total) by mouth 2 (two) times daily as needed for muscle spasms. Patient not taking: Reported on 02/10/2018 09/28/17   Kellie Shropshire, PA-C  dicyclomine (BENTYL) 20 MG tablet Take 1 tablet (20 mg total) by mouth  2 (two) times daily. 02/10/18   Cardama, Amadeo Garnet, MD  doxycycline (VIBRAMYCIN) 100 MG capsule Take 1 capsule (100 mg total) by mouth 2 (two) times daily. 05/23/18   Hedges, Tinnie Gens, PA-C  famotidine (PEPCID) 20 MG tablet Take 1 tablet (20 mg total) by mouth 2 (two) times daily. 11/11/18   Fawze, Mina A, PA-C  metoCLOPramide (REGLAN) 10 MG tablet Take 1 tablet (10 mg total) by mouth every 8 (eight) hours as needed for nausea or vomiting. 11/11/18   Michela Pitcher A, PA-C  naproxen (NAPROSYN) 500 MG tablet Take 1 tablet (500 mg total) by mouth 2 (two) times daily. 02/07/19   Antony Madura, PA-C  omeprazole (PRILOSEC) 20 MG capsule Take 1 capsule (20 mg total) by mouth daily for 14 days. 09/04/18 09/18/18  Aviva Kluver B, PA-C  ondansetron (ZOFRAN ODT) 4 MG disintegrating tablet Take 1 tablet (4 mg total) by mouth every 8 (eight) hours as needed for nausea or vomiting. 06/24/21   Concha Se, MD  polyethylene glycol powder (MIRALAX) powder Take 3 capfuls for day 1 and 2. On day 3 start taking 1 capful 3 times a day. Slowly cut back as needed until you have normal bowel movements 02/10/18   Cardama, Amadeo Garnet, MD  promethazine (PHENERGAN) 25 MG tablet Take 1 tablet (25 mg total) by mouth every 6 (six) hours as needed for nausea or vomiting. Patient not taking: Reported on 02/10/2018 07/19/16   Charlestine Night, PA-C  sucralfate (CARAFATE) 1 g  tablet Take 1 tablet (1 g total) by mouth 4 (four) times daily -  with meals and at bedtime for 14 days. 09/04/18 09/18/18  Aviva Kluver B, PA-C    No Known Allergies  No family history on file.  Social History Social History   Tobacco Use   Smoking status: Every Day    Pack years: 0.00    Types: E-cigarettes   Smokeless tobacco: Never  Vaping Use   Vaping Use: Never used  Substance Use Topics   Alcohol use: Not Currently   Drug use: Yes    Types: Marijuana    Comment: saturday last used    Review of Systems Constitutional: Positive for fevers  eyes 103 ENT: Positive for sore throat right greater than left Cardiovascular: Negative for chest pain. Respiratory: Negative for shortness of breath.  Occasional cough. Gastrointestinal: Abdominal pain yesterday, now resolved.  Positive for nausea vomiting yesterday, now resolved. Genitourinary: Negative for urinary compaints Musculoskeletal: Negative for musculoskeletal complaints Neurological: Negative for headache All other ROS negative  ____________________________________________   PHYSICAL EXAM:  VITAL SIGNS: ED Triage Vitals  Enc Vitals Group     BP 06/25/21 1202 132/82     Pulse Rate 06/25/21 1202 92     Resp 06/25/21 1202 17     Temp 06/25/21 1202 98.6 F (37 C)     Temp Source 06/25/21 1202 Oral     SpO2 06/25/21 1202 98 %     Weight 06/25/21 1202 210 lb (95.3 kg)     Height 06/25/21 1202 6\' 3"  (1.905 m)     Head Circumference --      Peak Flow --      Pain Score 06/25/21 1207 7     Pain Loc --      Pain Edu? --      Excl. in GC? --    Constitutional: Alert and oriented. Well appearing and in no distress. Eyes: Normal exam ENT      Head: Normocephalic and atraumatic.      Mouth/Throat: Mucous membranes are moist.  Bilateral tonsillar hypertrophy with exudates and erythema, right is greater than left but continues to have a midline uvula.  Moderate right anterior cervical lymphadenopathy on exam. Cardiovascular: Normal rate, regular rhythm. No murmur Respiratory: Normal respiratory effort without tachypnea nor retractions. Breath sounds are clear and equal bilaterally. No wheezes/rales/rhonchi. Gastrointestinal: Soft and nontender. No distention.   Musculoskeletal: Nontender with normal range of motion in all extremities.  Neurologic:  Normal speech and language. No gross focal neurologic deficits  Skin:  Skin is warm, dry and intact.  Psychiatric: Mood and affect are normal.   ____________________________________________   RADIOLOGY  Chest x-ray is  negative for acute abnormality.  ____________________________________________   INITIAL IMPRESSION / ASSESSMENT AND PLAN / ED COURSE  Pertinent labs & imaging results that were available during my care of the patient were reviewed by me and considered in my medical decision making (see chart for details).   Patient presented yesterday for fever sore throat abdominal pain nausea vomiting chills and weakness.  Patient had a CT scan that was negative of the abdomen, had COVID test that was negative, monotest was negative, influenza test was negative, strep a test was negative.  Patient was ultimately given a dose of Rocephin and discharged home blood cultures pending.  Blood cultures have resulted today both aerobic and anaerobic bottles grew out a Staphylococcus species.  It appears that this is Staph epidermidis which could very likely be  a contaminant however given the patient's fever as high as 103 and Staphylococcus species growing out of both aerobic and anaerobic bottles we will recheck blood cultures and dose IV antibiotics.  We will admit to the hospital service until the repeat blood cultures have grown out.  On examination patient continues to have a sore throat, has tonsillar hypertrophy bilaterally but right is greater than left along with right-sided cervical lymphadenopathy.  We will obtain a CT scan to rule out PTA.  CT scan is negative for PTA.  Patient mid to the hospitalist service.  Jariah Jarmon was evaluated in Emergency Department on 06/25/2021 for the symptoms described in the history of present illness. He was evaluated in the context of the global COVID-19 pandemic, which necessitated consideration that the patient might be at risk for infection with the SARS-CoV-2 virus that causes COVID-19. Institutional protocols and algorithms that pertain to the evaluation of patients at risk for COVID-19 are in a state of rapid change based on information released by regulatory bodies  including the CDC and federal and state organizations. These policies and algorithms were followed during the patient's care in the ED.  ____________________________________________   FINAL CLINICAL IMPRESSION(S) / ED DIAGNOSES  Fever Pharyngitis Bacteremia   Minna Antis, MD 06/25/21 1507

## 2021-06-25 NOTE — ED Notes (Signed)
Called dietary for meal tray at this time.

## 2021-06-25 NOTE — Consult Note (Signed)
NAME: Wayne Short  DOB: 1996/02/18  MRN: [id number]  Date/Time: 06/25/2021 4:06 PM  REQUESTING PROVIDER: Dr. Londell Moh Subjective:  REASON FOR CONSULT: Bacteremia ? Wayne Short is a 25 y.o. male presented to the ED on 06/24/2021 with nausea vomiting and pain in his throat with difficulty in swallowing. 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  He attended a funeral on Sunday..  Monday started to have nausea and vomiting.  He also noted that he was unable to swallow and it was hurting to swallow. On Tuesday he came to the hospital.  Temp was 103, heart rate 104, BP 126/75 and sats were 97%.  Blood culture was drawn and he was given IV ceftriaxone and IV Decadron and was discharged home.  He was sent on p.o. cephalosporin Patient was called today because 4 out of 4 blood culture was positive.  Epidermidis.  As per patient all the 4 bottles were drawn from the same site on the left arm. Today patient is feeling better.  He is able to swallow better.  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  Chest x-ray was normal. Patient said he had a murmur as a child and and is not sure whether he had a surgery. He smokes marijuana Used to do cocaine in the past but not anymore.  He has never used IV drugs.  New male partner 2 weeks ago.  Past Medical History:  Diagnosis Date   Heart murmur     Past Surgical History:  Procedure Laterality Date   CARDIAC SURGERY      Social History   Socioeconomic History   Marital status: Single    Spouse name: Not on file   Number of children: Not on file   Years of education: Not on file   Highest education level: Not on file  Occupational History   Not on file  Tobacco Use   Smoking status: Every Day    Pack years: 0.00    Types: E-cigarettes   Smokeless tobacco: Never  Vaping Use   Vaping Use: Never used  Substance and Sexual Activity   Alcohol use: Not Currently   Drug use: Yes    Types: Marijuana    Comment: saturday last used    Sexual activity: Not on file  Other Topics Concern   Not on file  Social History Narrative   Not on file   Social Determinants of Health   Financial Resource Strain: Not on file  Food Insecurity: Not on file  Transportation Needs: Not on file  Physical Activity: Not on file  Stress: Not on file  Social Connections: Not on file  Intimate Partner Violence: Not on file    Family history not known. No Known Allergies I? Current Facility-Administered Medications  Medication Dose Route Frequency Provider Last Rate Last Admin   acetaminophen (TYLENOL) tablet 650 mg  650 mg Oral Q6H PRN Cox, Amy N, DO       Or   acetaminophen (TYLENOL) suppository 650 mg  650 mg Rectal Q6H PRN Cox, Amy N, DO       enoxaparin (LOVENOX) injection 40 mg  40 mg Subcutaneous Q24H Cox, Amy N, DO       lactated ringers infusion   Intravenous Continuous Cox, Amy N, DO 125 mL/hr at 06/25/21 1452 New Bag at 06/25/21 1452   ondansetron (ZOFRAN) tablet 4 mg  4 mg Oral Q6H PRN Cox, Amy N, DO       Or  ondansetron (ZOFRAN) injection 4 mg  4 mg Intravenous Q6H PRN Cox, Amy N, DO       Current Outpatient Medications  Medication Sig Dispense Refill   benzonatate (TESSALON) 100 MG capsule Take 1 capsule (100 mg total) by mouth 3 (three) times daily as needed for cough. 21 capsule 0   cefdinir (OMNICEF) 300 MG capsule Take 1 capsule (300 mg total) by mouth 2 (two) times daily for 5 days. 10 capsule 0   cyclobenzaprine (FLEXERIL) 10 MG tablet Take 1 tablet (10 mg total) by mouth 2 (two) times daily as needed for muscle spasms. (Patient not taking: Reported on 02/10/2018) 20 tablet 0   dicyclomine (BENTYL) 20 MG tablet Take 1 tablet (20 mg total) by mouth 2 (two) times daily. 20 tablet 0   doxycycline (VIBRAMYCIN) 100 MG capsule Take 1 capsule (100 mg total) by mouth 2 (two) times daily. 20 capsule 0   famotidine (PEPCID) 20 MG tablet Take 1 tablet (20 mg total) by mouth 2 (two) times daily. 30 tablet 0   metoCLOPramide  (REGLAN) 10 MG tablet Take 1 tablet (10 mg total) by mouth every 8 (eight) hours as needed for nausea or vomiting. 12 tablet 0   naproxen (NAPROSYN) 500 MG tablet Take 1 tablet (500 mg total) by mouth 2 (two) times daily. 30 tablet 0   omeprazole (PRILOSEC) 20 MG capsule Take 1 capsule (20 mg total) by mouth daily for 14 days. 14 capsule 0   ondansetron (ZOFRAN ODT) 4 MG disintegrating tablet Take 1 tablet (4 mg total) by mouth every 8 (eight) hours as needed for nausea or vomiting. 20 tablet 0   polyethylene glycol powder (MIRALAX) powder Take 3 capfuls for day 1 and 2. On day 3 start taking 1 capful 3 times a day. Slowly cut back as needed until you have normal bowel movements 255 g 0   promethazine (PHENERGAN) 25 MG tablet Take 1 tablet (25 mg total) by mouth every 6 (six) hours as needed for nausea or vomiting. (Patient not taking: Reported on 02/10/2018) 10 tablet 0   sucralfate (CARAFATE) 1 g tablet Take 1 tablet (1 g total) by mouth 4 (four) times daily -  with meals and at bedtime for 14 days. 56 tablet 0     Abtx:  Anti-infectives (From admission, onward)    Start     Dose/Rate Route Frequency Ordered Stop   06/25/21 1600  cefTRIAXone (ROCEPHIN) 2 g in sodium chloride 0.9 % 100 mL IVPB  Status:  Discontinued        2 g 200 mL/hr over 30 Minutes Intravenous Every 24 hours 06/25/21 1424 06/25/21 1532   06/25/21 1415  cefTRIAXone (ROCEPHIN) 1 g in sodium chloride 0.9 % 100 mL IVPB  Status:  Discontinued        1 g 200 mL/hr over 30 Minutes Intravenous  Once 06/25/21 1414 06/25/21 1424       REVIEW OF SYSTEMS:  Const:  fever, negative chills, negative weight loss Eyes: negative diplopia or visual changes, negative eye pain ENT: negative coryza, pain throat, pain on swallowing:, Resp: negative cough, hemoptysis, dyspnea Cards: negative for chest pain, palpitations, lower extremity edema GU: negative for frequency, dysuria and hematuria GI: Nausea and vomiting. Skin: negative for  rash and pruritus Heme: negative for easy bruising and gum/nose bleeding MS: Weakness Neurolo:negative for headaches, dizziness, vertigo, memory problems  Psych: negative for feelings of anxiety, depression  Endocrine: negative for thyroid, diabetes Allergy/Immunology- negative for any medication or  food allergies ? Objective:  VITALS:  BP 132/82 (BP Location: Left Arm)   Pulse 92   Temp 98.6 F (37 C) (Oral)   Resp 17   Ht  (1.905 m)   Wt 95 kg   SpO2 98%   BMI 26.18 kg/m  PHYSICAL EXAM:  General: Alert, cooperative, no distress, appears stated age.  Head: Normocephalic, without obvious abnormality, atraumatic. Eyes: Conjunctivae clear, anicteric sclerae. Pupils are equal ENT Nares normal. No drainage or sinus tenderness. Right tonsil is inflamed with pustular points Left tonsil is inflamed Right cervical node palpable Neck: Supple, symmetrical, no adenopathy, thyroid: non tender no carotid bruit and no JVD. Back: No CVA tenderness. Lungs: Clear to auscultation bilaterally. No Wheezing or Rhonchi. No rales. Heart: Regular rate and rhythm, no murmur, rub or gallop. Abdomen: Soft, non-tender,not distended. Bowel sounds normal. No masses Extremities: atraumatic, no cyanosis. No edema. No clubbing Skin: No rashes or lesions. Or bruising Lymph: Cervical, supraclavicular normal. Neurologic: Grossly non-focal Pertinent Labs Lab Results CBC    Component Value Date/Time   WBC 11.1 (H) 06/25/2021 1204   RBC 4.36 06/25/2021 1204   HGB 14.1 06/25/2021 1204   HCT 39.2 06/25/2021 1204   PLT 296 06/25/2021 1204   MCV 89.9 06/25/2021 1204   MCH 32.3 06/25/2021 1204   MCHC 36.0 06/25/2021 1204   RDW 11.9 06/25/2021 1204   LYMPHSABS 1.2 06/25/2021 1204   MONOABS 1.1 (H) 06/25/2021 1204   EOSABS 0.0 06/25/2021 1204   BASOSABS 0.0 06/25/2021 1204    CMP Latest Ref Rng & Units 06/25/2021 06/24/2021 11/11/2018  Glucose 70 - 99 mg/dL 409(W) 119(J) 82  BUN 6 - 20 mg/dL Creatinine 0.61 - 1.24 mg/dL 4.78 2.95 6.21  Sodium 135 - 145 mmol/L 135 135 138  Potassium 3.5 - 5.1 mmol/L 3.3(L) 3.3(L) 5.2(H)  Chloride 98 - 111 mmol/L 103 102 101  CO2 22 - 32 mmol/L Calcium 8.9 - 10.3 mg/dL 9.0 9.1 10.4(H)  Total Protein 6.5 - 8.1 g/dL 7.5 7.3 3.0(Q)  Total Bilirubin 0.3 - 1.2 mg/dL 0.7 1.0 2.0(H)  Alkaline Phos 38 - 126 U/L 59 64 59  AST 15 - 41 U/L ALT 0 - 44 U/L Microbiology: Recent Results (from the past 240 hour(s))  Group A Strep by PCR ([facility] Only)     Status: None   Collection Time: 06/24/21  2:50 PM   Specimen: Throat; Sterile Swab  Result Value Ref Range Status   Group A Strep by PCR NOT DETECTED NOT DETECTED Final    Comment: Performed at [facility], [address] [place], [place] [address]  Resp Panel by RT-PCR (Flu A&B, Covid) Throat     Status: None   Collection Time: 06/24/21  2:50 PM   Specimen: Throat; Nasopharyngeal(NP) swabs in vial transport medium  Result Value Ref Range Status   SARS Coronavirus 2 by RT PCR NEGATIVE NEGATIVE Final    Comment: (NOTE) SARS-CoV-2 target nucleic acids are NOT DETECTED.  The SARS-CoV-2 RNA is generally detectable in upper respiratory specimens during the acute phase of infection. The lowest concentration of SARS-CoV-2 viral copies this assay can detect is 138 copies/mL. A negative result does not preclude SARS-Cov-2 infection and should not be used as the sole basis for treatment or other patient management decisions. A negative result may occur with  improper specimen collection/handling, submission of specimen other  than nasopharyngeal swab, presence of viral mutation(s) within the areas targeted by this assay, and inadequate number of viral copies(<138 copies/mL). A negative result must be combined with clinical observations, patient history, and epidemiological information. The expected result is Negative.  Fact Sheet for Patients:   BloggerCourse.com  Fact Sheet for Healthcare Providers:  SeriousBroker.it  This test is no t yet approved or cleared by the [place] FDA and  has been authorized for detection and/or diagnosis of SARS-CoV-2 by FDA under an Emergency Use Authorization (EUA). This EUA will remain  in effect (meaning this test can be used) for the duration of the COVID-19 declaration under Section 564(b)(1) of the Act, 21 U.S.C.section 360bbb-3(b)(1), unless the authorization is terminated  or revoked sooner.       Influenza A by PCR NEGATIVE NEGATIVE Final   Influenza B by PCR NEGATIVE NEGATIVE Final    Comment: (NOTE) The Xpert Xpress SARS-CoV-2/FLU/RSV plus assay is intended as an aid in the diagnosis of influenza from Nasopharyngeal swab specimens and should not be used as a sole basis for treatment. Nasal washings and aspirates are unacceptable for Xpert Xpress SARS-CoV-2/FLU/RSV testing.  Fact Sheet for Patients: BloggerCourse.com  Fact Sheet for Healthcare Providers: SeriousBroker.it  This test is not yet approved or cleared by the [place] FDA and has been authorized for detection and/or diagnosis of SARS-CoV-2 by FDA under an Emergency Use Authorization (EUA). This EUA will remain in effect (meaning this test can be used) for the duration of the COVID-19 declaration under Section 564(b)(1) of the Act, 21 U.S.C. section 360bbb-3(b)(1), unless the authorization is terminated or revoked.  Performed at [facility], [address] [place], [place] [address]   Blood culture (routine x 2)     Status: None (Preliminary result)   Collection Time: 06/24/21  7:21 PM   Specimen: BLOOD  Result Value Ref Range Status   Specimen Description BLOOD LEFT ANTECUBITAL  Final   Special Requests   Final    BOTTLES DRAWN AEROBIC AND ANAEROBIC Blood Culture adequate volume   Culture   Setup Time   Final    GRAM POSITIVE COCCI IN BOTH AEROBIC AND ANAEROBIC BOTTLES CRITICAL RESULT CALLED TO, READ BACK BY AND VERIFIED WITH: [facility] HAMILTON 06/25/21 1025 KLW Performed at [facility], [address] [place], [place] [address]    Culture GRAM POSITIVE COCCI  Final   Report Status PENDING  Incomplete  Blood culture (routine x 2)     Status: None (Preliminary result)   Collection Time: 06/24/21  7:21 PM   Specimen: BLOOD  Result Value Ref Range Status   Specimen Description   Final    BLOOD BLOOD LEFT FOREARM Performed at [facility], [address], [place], [place] [address]    Special Requests   Final    BOTTLES DRAWN AEROBIC AND ANAEROBIC Blood Culture adequate volume Performed at [facility], [address] [place], [place] [address]    Culture  Setup Time   Final    IN BOTH AEROBIC AND ANAEROBIC BOTTLES GRAM POSITIVE COCCI CRITICAL RESULT CALLED TO, READ BACK BY AND VERIFIED WITH: SAMANTHA HAMILTON 06/25/21 1025 KLW Performed at [facility], [address], [place], [place] [address]    Culture [facility] POSITIVE COCCI  Final   Report Status PENDING  Incomplete  Blood Culture ID Panel (Reflexed)     Status: Abnormal   Collection Time: 06/24/21  7:21 PM  Result Value Ref Range Status  Enterococcus faecalis NOT DETECTED NOT DETECTED Final   Enterococcus Faecium NOT DETECTED NOT DETECTED Final   Listeria monocytogenes NOT DETECTED NOT DETECTED Final   Staphylococcus species DETECTED (A) NOT DETECTED Final    Comment: CRITICAL RESULT CALLED TO, READ BACK BY AND VERIFIED WITH: SAMANTHA HAMILTON 06/25/21 1025 KLW    Staphylococcus aureus (BCID) NOT DETECTED NOT DETECTED Final   Staphylococcus epidermidis DETECTED (A) NOT DETECTED Final    Comment: Methicillin (oxacillin) resistant coagulase negative staphylococcus. Possible blood culture contaminant (unless isolated from more than one blood culture draw or clinical case suggests  pathogenicity). No antibiotic treatment is indicated for blood  culture contaminants. CRITICAL RESULT CALLED TO, READ BACK BY AND VERIFIED WITH: SAMANTHA HAMILTON 06/25/21 1025 KLW    Staphylococcus lugdunensis NOT DETECTED NOT DETECTED Final   Streptococcus species NOT DETECTED NOT DETECTED Final   Streptococcus agalactiae NOT DETECTED NOT DETECTED Final   Streptococcus pneumoniae NOT DETECTED NOT DETECTED Final   Streptococcus pyogenes NOT DETECTED NOT DETECTED Final   A.calcoaceticus-baumannii NOT DETECTED NOT DETECTED Final   Bacteroides fragilis NOT DETECTED NOT DETECTED Final   Enterobacterales NOT DETECTED NOT DETECTED Final   Enterobacter cloacae complex NOT DETECTED NOT DETECTED Final   Escherichia coli NOT DETECTED NOT DETECTED Final   Klebsiella aerogenes NOT DETECTED NOT DETECTED Final   Klebsiella oxytoca NOT DETECTED NOT DETECTED Final   Klebsiella pneumoniae NOT DETECTED NOT DETECTED Final   Proteus species NOT DETECTED NOT DETECTED Final   Salmonella species NOT DETECTED NOT DETECTED Final   Serratia marcescens NOT DETECTED NOT DETECTED Final   Haemophilus influenzae NOT DETECTED NOT DETECTED Final   Neisseria meningitidis NOT DETECTED NOT DETECTED Final   Pseudomonas aeruginosa NOT DETECTED NOT DETECTED Final   Stenotrophomonas maltophilia NOT DETECTED NOT DETECTED Final   Candida albicans NOT DETECTED NOT DETECTED Final   Candida auris NOT DETECTED NOT DETECTED Final   Candida glabrata NOT DETECTED NOT DETECTED Final   Candida krusei NOT DETECTED NOT DETECTED Final   Candida parapsilosis NOT DETECTED NOT DETECTED Final   Candida tropicalis NOT DETECTED NOT DETECTED Final   Cryptococcus neoformans/gattii NOT DETECTED NOT DETECTED Final   Methicillin resistance mecA/C DETECTED (A) NOT DETECTED Final    Comment: CRITICAL RESULT CALLED TO, READ BACK BY AND VERIFIED WITH: SAMANTHA HAMILTON 06/25/21 1025 KLW Performed at [facility], [address]  [place], [place] [address]   Culture, blood (Routine x 2)     Status: None (Preliminary result)   Collection Time: 06/25/21 12:05 PM   Specimen: Right Antecubital; Blood  Result Value Ref Range Status   Specimen Description RIGHT ANTECUBITAL  Final   Special Requests   Final    Blood Culture adequate volume BOTTLES DRAWN AEROBIC AND ANAEROBIC   Culture   Final    NO GROWTH < 12 HOURS Performed at [facility], [address] [place], [place] [address]    Report Status PENDING  Incomplete  Culture, blood (Routine x 2)     Status: None (Preliminary result)   Collection Time: 06/25/21  1:54 PM   Specimen: BLOOD  Result Value Ref Range Status   Specimen Description BLOOD LACTOSE FERMENTER  Final   Special Requests   Final    BOTTLES DRAWN AEROBIC AND ANAEROBIC Blood Culture adequate volume   Culture   Final    NO GROWTH <12 HOURS Performed at [facility], [address], [place], [place] [address]    Report Status PENDING  Incomplete  IMAGING RESULTS:  I have personally reviewed the films ?  Purulent tonsillitis.  Group B strep and Monospot were negative yesterday. Patient currently on ceftriaxone.  Will change to Unasyn to cover Fusobacterium  Staph epidermidis bacteremia is contamination.  All the 4 bottles were drawn from 1 site.  There is no evidence clinically or by history to suggest that he there is an infection.  He does not have any device.  Hence will not treat for  Marijuana use  Discussed the management with the patient and Dr. Sedalia Mutaox ? ?r Note:  This document was prepared using Dragon voice recognition software and may include unintentional dictation errors.

## 2021-06-25 NOTE — ED Triage Notes (Signed)
Pt to ED, called back for positive blood cultures. States having sore throat. Pt in NAD

## 2021-06-25 NOTE — ED Notes (Signed)
Meal tray at bedside.  

## 2021-06-26 ENCOUNTER — Encounter: Payer: Self-pay | Admitting: Family Medicine

## 2021-06-26 ENCOUNTER — Observation Stay (HOSPITAL_BASED_OUTPATIENT_CLINIC_OR_DEPARTMENT_OTHER)
Admit: 2021-06-26 | Discharge: 2021-06-26 | Disposition: A | Discharge status: Home / Self Care | Payer: Self-pay | Attending: Internal Medicine | Admitting: Internal Medicine

## 2021-06-26 DIAGNOSIS — R7881 Bacteremia: Secondary | ICD-10-CM

## 2021-06-26 DIAGNOSIS — R509 Fever, unspecified: Secondary | ICD-10-CM

## 2021-06-26 DIAGNOSIS — J039 Acute tonsillitis, unspecified: Secondary | ICD-10-CM

## 2021-06-26 LAB — MAGNESIUM: Magnesium: 1.5 mg/dL — ABNORMAL LOW (ref 1.7–2.4)

## 2021-06-26 LAB — URINALYSIS, COMPLETE (UACMP) WITH MICROSCOPIC
Bilirubin Urine: NEGATIVE
Glucose, UA: NEGATIVE mg/dL
Ketones, ur: NEGATIVE mg/dL
Leukocytes,Ua: NEGATIVE
Nitrite: NEGATIVE
Protein, ur: NEGATIVE mg/dL
Specific Gravity, Urine: 1.025 (ref 1.005–1.030)
pH: 8 (ref 5.0–8.0)

## 2021-06-26 LAB — CBC
HCT: 38.4 % — ABNORMAL LOW (ref 39.0–52.0)
Hemoglobin: 13.4 g/dL (ref 13.0–17.0)
MCH: 32 pg (ref 26.0–34.0)
MCHC: 34.9 g/dL (ref 30.0–36.0)
MCV: 91.6 fL (ref 80.0–100.0)
Platelets: 295 10*3/uL (ref 150–400)
RBC: 4.19 MIL/uL — ABNORMAL LOW (ref 4.22–5.81)
RDW: 12 % (ref 11.5–15.5)
WBC: 9.3 10*3/uL (ref 4.0–10.5)
nRBC: 0 % (ref 0.0–0.2)

## 2021-06-26 LAB — BASIC METABOLIC PANEL
Anion gap: 7 (ref 5–15)
BUN: 7 mg/dL (ref 6–20)
CO2: 30 mmol/L (ref 22–32)
Calcium: 8.9 mg/dL (ref 8.9–10.3)
Chloride: 102 mmol/L (ref 98–111)
Creatinine, Ser: 0.91 mg/dL (ref 0.61–1.24)
GFR, Estimated: >60 mL/min (ref >60)
Glucose, Bld: 113 mg/dL — ABNORMAL HIGH (ref 70–99)
Potassium: 3.1 mmol/L — ABNORMAL LOW (ref 3.5–5.1)
Sodium: 139 mmol/L (ref 135–145)

## 2021-06-26 LAB — ECHOCARDIOGRAM COMPLETE
AR max vel: 2.84 cm2
AV Area VTI: 2.72 cm2
AV Area mean vel: 2.75 cm2
AV Mean grad: 5 mmHg
AV Peak grad: 9.7 mmHg
Ao pk vel: 1.56 m/s
Area-P 1/2: 3.93 cm2
Height: 75 in
S' Lateral: 2.77 cm
Weight: 3350.99 oz

## 2021-06-26 LAB — URINE CULTURE

## 2021-06-26 LAB — GROUP A STREP BY PCR: Group A Strep by PCR: NOT DETECTED

## 2021-06-26 LAB — CHLAMYDIA/NGC RT PCR (ARMC ONLY)
Chlamydia Tr: NOT DETECTED
N gonorrhoeae: NOT DETECTED

## 2021-06-26 MED ORDER — PROMETHAZINE HCL 25 MG PO TABS: 12.5000 mg | ORAL_TABLET | Freq: Four times a day (QID) | ORAL | Status: DC | PRN

## 2021-06-26 MED ORDER — AMOXICILLIN-POT CLAVULANATE 875-125 MG PO TABS: 1.0000 | ORAL_TABLET | Freq: Two times a day (BID) | ORAL | 0 refills | Status: AC

## 2021-06-26 MED ORDER — AMOXICILLIN-POT CLAVULANATE 875-125 MG PO TABS: 1.0000 | ORAL_TABLET | Freq: Two times a day (BID) | ORAL | 0 refills | Status: DC

## 2021-06-26 MED ORDER — OXYCODONE HCL 5 MG PO TABS: 5.0000 mg | ORAL_TABLET | ORAL | Status: DC | PRN

## 2021-06-26 MED ORDER — SODIUM CHLORIDE 0.9 % IV SOLN
6.2500 mg | Freq: Four times a day (QID) | INTRAVENOUS | Status: DC | PRN
  Administered 2021-06-26: 6.25 mg via INTRAVENOUS
  Filled 2021-06-26 (×3): qty 0.25

## 2021-06-26 MED ORDER — PROMETHAZINE HCL 25 MG RE SUPP
12.5000 mg | Freq: Four times a day (QID) | RECTAL | Status: DC | PRN
  Filled 2021-06-26: qty 1

## 2021-06-26 MED ORDER — KETOROLAC TROMETHAMINE 30 MG/ML IJ SOLN: 15.0000 mg | Freq: Four times a day (QID) | INTRAMUSCULAR | Status: DC | PRN

## 2021-06-26 NOTE — Progress Notes (Signed)
*  PRELIMINARY RESULTS* Echocardiogram 2D Echocardiogram has been performed.  Wayne Short 06/26/2021, 9:09 AM

## 2021-06-26 NOTE — Progress Notes (Signed)
Patient arrived to floor as new admit at this time. Patient alert and oriented, requesting discharge orders. Says his throat is the only thing bothering him and he would rather be home with hot tea and lemon. Secure chat sent to Dr. Lowell Guitar with patient request.

## 2021-06-26 NOTE — Progress Notes (Signed)
PROGRESS NOTE    Wayne LeyCyrus Flam  UJW:[id number] DOB: 02/14/1996 DOA: 06/25/2021 PCP: Patient, No Pcp Per (Inactive)   Chief Complaint  Patient presents with   abnormal labs    Brief Narrative:  Wayne Short is Wayne Short 25 y.o. male with no previous medical history significant presents to the emergency department for chief concerns of bacteremia.  He was seen for N/V and abdominal discomfort.  He had imaging finding concerning for tonsillitis.  Bacteremia is felt to be contaminant.  ID following.   Assessment & Plan:   Active Problems:   Gram-positive bacteremia   Hypokalemia   Sepsis (HCC)  Tonsillitis  Fever  Nausea  Vomiting  Abdominal Discomfort Fever and constellation of sx, related to tonsillitis - ? Viral vs bacterial  Negative monospot, group Stacy Deshler strep PCR, gonorrhea/chlamydia Appreciate ID assistance, continue unasyn  Recurrent fevers persistent today Symptomatic management of nausea, abdominal discomfort with analgesics/antiemetics  Coagulase Negative Staph Bacteremia Likely contaminant, growing staph hominis and staph epi  Repeat cultures currently NGTD Echo with normal MV, AV - see report  Pyuria  Microscopic Hematuria Negative urine gc/chlam 6/28 urine studes notable for hematuria/pyuria -> resolved at this hospitalization UA not concerning for UTI at this admission Urine cx with multiple species Follow outpatient   DVT prophylaxis: lovenox Code Status: full  Family Communication: none at bedside Disposition:   Status is: Observation  The patient will require care spanning > 2 midnights and should be moved to inpatient because: Inpatient level of care appropriate due to severity of illness  Dispo: The patient is from: Home              Anticipated d/c is to: Home              Patient currently is not medically stable to d/c.   Difficult to place patient No       Consultants:  ID  Procedures:  Echo IMPRESSIONS     1. Left ventricular ejection  fraction, by estimation, is 60 to 65%. The  left ventricle has normal function. The left ventricle has no regional  wall motion abnormalities. Left ventricular diastolic parameters were  normal.   2. Right ventricular systolic function is normal. The right ventricular  size is not well visualized.   3. The mitral valve is normal in structure. No evidence of mitral valve  regurgitation. No evidence of mitral stenosis.   4. The aortic valve is normal in structure. Aortic valve regurgitation is  not visualized. No aortic stenosis is present.   Antimicrobials:  Anti-infectives (From admission, onward)    Start     Dose/Rate Route Frequency Ordered Stop   06/25/21 1800  Ampicillin-Sulbactam (UNASYN) 3 g in sodium chloride 0.9 % 100 mL IVPB        3 g 200 mL/hr over 30 Minutes Intravenous Every 6 hours 06/25/21 1624     06/25/21 1600  cefTRIAXone (ROCEPHIN) 2 g in sodium chloride 0.9 % 100 mL IVPB  Status:  Discontinued        2 g 200 mL/hr over 30 Minutes Intravenous Every 24 hours 06/25/21 1424 06/25/21 1532   06/25/21 1415  cefTRIAXone (ROCEPHIN) 1 g in sodium chloride 0.9 % 100 mL IVPB  Status:  Discontinued        1 g 200 mL/hr over 30 Minutes Intravenous  Once 06/25/21 1414 06/25/21 1424      Subjective: C/o generalized abdominal discomfort  Objective: Vitals:   06/25/21 2143 06/26/21 0031 06/26/21 0353 06/26/21  1300  BP: 132/78 128/72 130/80 100/61  Pulse: 70 62 70 86  Resp: Temp: 98.5 F (36.9 C) 98.6 F (37 C) 98.6 F (37 C) (!) 101.7 F (38.7 C)  TempSrc: Oral Oral Oral Oral  SpO2: 99% 100% 100% 97%  Weight:      Height:        Intake/Output Summary (Last 24 hours) at 06/26/2021 1421 Last data filed at 06/25/2021 1801 Gross per 24 hour  Intake 393.34 ml  Output --  Net 393.34 ml   Filed Weights   06/25/21 1202 06/25/21 1207  Weight: 95.3 kg 95 kg    Examination:  General exam: Appears calm and comfortable  HEENT: bilateral fullness to  submandibular exam, mild TTP - tonsillar swelling and exuate Respiratory system: Clear to auscultation. Respiratory effort normal. Cardiovascular system: S1 & S2 heard, RRR.  Gastrointestinal system: Abdomen is nondistended, soft and nontender.  Central nervous system: Alert and oriented. No focal neurological deficits. Extremities: no LEE Skin: No rashes, lesions or ulcers Psychiatry: Judgement and insight appear normal. Mood & affect appropriate.     Data Reviewed: I have personally reviewed following labs and imaging studies  CBC: Recent Labs  Lab 06/24/21 1229 06/25/21 1204 06/26/21 0507  WBC 9.4 11.1* 9.3  NEUTROABS  --  8.8*  --   HGB 14.1 14.1 13.4  HCT 40.0 39.2 38.4*  MCV 92.0 89.9 91.6  PLT 299 296 295    Basic Metabolic Panel: Recent Labs  Lab 06/24/21 1229 06/25/21 1204 06/26/21 0507  NA 135 135 139  K 3.3* 3.3* 3.1*  CL 102 103 102  CO2 GLUCOSE 106* 151* 113*  BUN CREATININE 0.98 0.72 0.91  CALCIUM 9.1 9.0 8.9  MG  --   --  1.5*    GFR: Estimated Creatinine Clearance: 147 mL/min (by C-G formula based on SCr of 0.91 mg/dL).  Liver Function Tests: Recent Labs  Lab 06/24/21 1229 06/25/21 1204  AST 17 18  ALT 17 14  ALKPHOS 64 59  BILITOT 1.0 0.7  PROT 7.3 7.5  ALBUMIN 4.0 3.7    CBG: No results for input(s): GLUCAP in the last 168 hours.   Recent Results (from the past 240 hour(s))  Group Pairlee Sawtell Strep by PCR ([facility] Only)     Status: None   Collection Time: 06/24/21  2:50 PM   Specimen: Throat; Sterile Swab  Result Value Ref Range Status   Group Nyliah Nierenberg Strep by PCR NOT DETECTED NOT DETECTED Final    Comment: Performed at [facility], [address] [place], [place] [address]  Resp Panel by RT-PCR (Flu Marne Meline&B, Covid) Throat     Status: None   Collection Time: 06/24/21  2:50 PM   Specimen: Throat; Nasopharyngeal(NP) swabs in vial transport medium  Result Value Ref Range Status   SARS Coronavirus 2 by RT PCR NEGATIVE  NEGATIVE Final    Comment: (NOTE) SARS-CoV-2 target nucleic acids are NOT DETECTED.  The SARS-CoV-2 RNA is generally detectable in upper respiratory specimens during the acute phase of infection. The lowest concentration of SARS-CoV-2 viral copies this assay can detect is 138 copies/mL. Kyo Cocuzza negative result does not preclude SARS-Cov-2 infection and should not be used as the sole basis for treatment or other patient management decisions. Tashawna Thom negative result may occur with  improper specimen collection/handling, submission of specimen other than nasopharyngeal swab, presence of viral mutation(s) within the areas targeted by this  assay, and inadequate number of viral copies(<138 copies/mL). Chancelor Hardrick negative result must be combined with clinical observations, patient history, and epidemiological information. The expected result is Negative.  Fact Sheet for Patients:  BloggerCourse.com  Fact Sheet for Healthcare Providers:  SeriousBroker.it  This test is no t yet approved or cleared by the [place] FDA and  has been authorized for detection and/or diagnosis of SARS-CoV-2 by FDA under an Emergency Use Authorization (EUA). This EUA will remain  in effect (meaning this test can be used) for the duration of the COVID-19 declaration under Section 564(b)(1) of the Act, 21 U.S.C.section 360bbb-3(b)(1), unless the authorization is terminated  or revoked sooner.       Influenza Marya Lowden by PCR NEGATIVE NEGATIVE Final   Influenza B by PCR NEGATIVE NEGATIVE Final    Comment: (NOTE) The Xpert Xpress SARS-CoV-2/FLU/RSV plus assay is intended as an aid in the diagnosis of influenza from Nasopharyngeal swab specimens and should not be used as Laray Corbit sole basis for treatment. Nasal washings and aspirates are unacceptable for Xpert Xpress SARS-CoV-2/FLU/RSV testing.  Fact Sheet for Patients: BloggerCourse.com  Fact Sheet for Healthcare  Providers: SeriousBroker.it  This test is not yet approved or cleared by the [place] FDA and has been authorized for detection and/or diagnosis of SARS-CoV-2 by FDA under an Emergency Use Authorization (EUA). This EUA will remain in effect (meaning this test can be used) for the duration of the COVID-19 declaration under Section 564(b)(1) of the Act, 21 U.S.C. section 360bbb-3(b)(1), unless the authorization is terminated or revoked.  Performed at [facility], [address] [place], [place] [address]   Urine culture     Status: Abnormal   Collection Time: 06/24/21  3:50 PM   Specimen: Urine, Random  Result Value Ref Range Status   Specimen Description   Final    URINE, RANDOM Performed at [facility], [address], [place], [place] [address]    Special Requests   Final    NONE Performed at [facility], [address] [place], [place] [address]    Culture MULTIPLE SPECIES PRESENT, SUGGEST RECOLLECTION (Jomo Forand)  Final   Report Status 06/26/2021 FINAL  Final  Blood culture (routine x 2)     Status: Abnormal (Preliminary result)   Collection Time: 06/24/21  7:21 PM   Specimen: BLOOD  Result Value Ref Range Status   Specimen Description   Final    BLOOD LEFT ANTECUBITAL Performed at [facility], [address] [place], [place] [address]    Special Requests   Final    BOTTLES DRAWN AEROBIC AND ANAEROBIC Blood Culture adequate volume Performed at [facility], [address] [place], [place] [address]    Culture  Setup Time   Final    GRAM POSITIVE COCCI IN BOTH AEROBIC AND ANAEROBIC BOTTLES CRITICAL RESULT CALLED TO, READ BACK BY AND VERIFIED WITH: [facility] HAMILTON 06/25/21 1025 KLW Performed at [facility], [address] [place], [place] [address]    Culture STAPHYLOCOCCUS EPIDERMIDIS (Brynleigh Sequeira)  Final   Report Status PENDING  Incomplete  Blood culture (routine x 2)     Status:  Abnormal (Preliminary result)   Collection Time: 06/24/21  7:21 PM   Specimen: BLOOD  Result Value Ref Range Status   Specimen Description   Final    BLOOD BLOOD LEFT FOREARM Performed at [facility], [address] [place], [place] [address]    Special Requests   Final    BOTTLES  DRAWN AEROBIC AND ANAEROBIC Blood Culture adequate volume Performed at [facility], [address] [place], [place] [address]    Culture  Setup Time   Final    IN BOTH AEROBIC AND ANAEROBIC BOTTLES GRAM POSITIVE COCCI CRITICAL RESULT CALLED TO, READ BACK BY AND VERIFIED WITH: SAMANTHA HAMILTON 06/25/21 1025 KLW    Culture (Malay Fantroy)  Final    STAPHYLOCOCCUS HOMINIS STAPHYLOCOCCUS EPIDERMIDIS SUSCEPTIBILITIES TO FOLLOW Performed at [facility], [address] [place], [place] [address]    Report Status PENDING  Incomplete  Blood Culture ID Panel (Reflexed)     Status: Abnormal   Collection Time: 06/24/21  7:21 PM  Result Value Ref Range Status   Enterococcus faecalis NOT DETECTED NOT DETECTED Final   Enterococcus Faecium NOT DETECTED NOT DETECTED Final   Listeria monocytogenes NOT DETECTED NOT DETECTED Final   Staphylococcus species DETECTED (Venice Liz) NOT DETECTED Final    Comment: CRITICAL RESULT CALLED TO, READ BACK BY AND VERIFIED WITH: SAMANTHA HAMILTON 06/25/21 1025 KLW    Staphylococcus aureus (BCID) NOT DETECTED NOT DETECTED Final   Staphylococcus epidermidis DETECTED (Aundra Pung) NOT DETECTED Final    Comment: Methicillin (oxacillin) resistant coagulase negative staphylococcus. Possible blood culture contaminant (unless isolated from more than one blood culture draw or clinical case suggests pathogenicity). No antibiotic treatment is indicated for blood  culture contaminants. CRITICAL RESULT CALLED TO, READ BACK BY AND VERIFIED WITH: SAMANTHA HAMILTON 06/25/21 1025 KLW    Staphylococcus lugdunensis NOT DETECTED NOT DETECTED Final   Streptococcus species NOT DETECTED NOT DETECTED Final    Streptococcus agalactiae NOT DETECTED NOT DETECTED Final   Streptococcus pneumoniae NOT DETECTED NOT DETECTED Final   Streptococcus pyogenes NOT DETECTED NOT DETECTED Final   Bessie Livingood.calcoaceticus-baumannii NOT DETECTED NOT DETECTED Final   Bacteroides fragilis NOT DETECTED NOT DETECTED Final   Enterobacterales NOT DETECTED NOT DETECTED Final   Enterobacter cloacae complex NOT DETECTED NOT DETECTED Final   Escherichia coli NOT DETECTED NOT DETECTED Final   Klebsiella aerogenes NOT DETECTED NOT DETECTED Final   Klebsiella oxytoca NOT DETECTED NOT DETECTED Final   Klebsiella pneumoniae NOT DETECTED NOT DETECTED Final   Proteus species NOT DETECTED NOT DETECTED Final   Salmonella species NOT DETECTED NOT DETECTED Final   Serratia marcescens NOT DETECTED NOT DETECTED Final   Haemophilus influenzae NOT DETECTED NOT DETECTED Final   Neisseria meningitidis NOT DETECTED NOT DETECTED Final   Pseudomonas aeruginosa NOT DETECTED NOT DETECTED Final   Stenotrophomonas maltophilia NOT DETECTED NOT DETECTED Final   Candida albicans NOT DETECTED NOT DETECTED Final   Candida auris NOT DETECTED NOT DETECTED Final   Candida glabrata NOT DETECTED NOT DETECTED Final   Candida krusei NOT DETECTED NOT DETECTED Final   Candida parapsilosis NOT DETECTED NOT DETECTED Final   Candida tropicalis NOT DETECTED NOT DETECTED Final   Cryptococcus neoformans/gattii NOT DETECTED NOT DETECTED Final   Methicillin resistance mecA/C DETECTED (Caralina Nop) NOT DETECTED Final    Comment: CRITICAL RESULT CALLED TO, READ BACK BY AND VERIFIED WITH: SAMANTHA HAMILTON 06/25/21 1025 KLW Performed at [facility] [address] [place], [place] [address]   Culture, blood (Routine x 2)     Status: None (Preliminary result)   Collection Time: 06/25/21 12:05 PM   Specimen: Right Antecubital; Blood  Result Value Ref Range Status   Specimen Description RIGHT ANTECUBITAL  Final   Special Requests   Final    Blood Culture adequate  volume BOTTLES DRAWN AEROBIC AND ANAEROBIC   Culture  Final    NO GROWTH < 24 HOURS Performed at [facility], [address] [place], [place] [address]    Report Status PENDING  Incomplete  Culture, blood (Routine x 2)     Status: None (Preliminary result)   Collection Time: 06/25/21  1:54 PM   Specimen: BLOOD  Result Value Ref Range Status   Specimen Description BLOOD LACTOSE FERMENTER  Final   Special Requests   Final    BOTTLES DRAWN AEROBIC AND ANAEROBIC Blood Culture adequate volume   Culture   Final    NO GROWTH < 24 HOURS Performed at [facility], [address] [place], [place] [address]    Report Status PENDING  Incomplete  Chlamydia/NGC rt PCR ([facility] only)     Status: None   Collection Time: 06/25/21  4:25 PM   Specimen: Throat  Result Value Ref Range Status   Specimen source GC/Chlam URINE, RANDOM  Final   Chlamydia Tr NOT DETECTED NOT DETECTED Final   N gonorrhoeae NOT DETECTED NOT DETECTED Final    Comment: (NOTE) This CT/NG assay has not been evaluated in patients with Karin Pinedo history of  hysterectomy. Performed at [facility], [address] [place], [place] [address]   Group Shaena Parkerson Strep by PCR     Status: None   Collection Time: 06/26/21 10:02 AM   Specimen: Sterile Swab  Result Value Ref Range Status   Group Myliah Medel Strep by PCR NOT DETECTED NOT DETECTED Final    Comment: Performed at [facility], [address] [place], [place] [address]  Chlamydia/NGC rt PCR [facility] only)     Status: None   Collection Time: 06/26/21 10:02 AM   Specimen: Oral Cytologic Material  Result Value Ref Range Status   Specimen source GC/Chlam THROAT  Final   Chlamydia Tr NOT DETECTED NOT DETECTED Final   N gonorrhoeae NOT DETECTED NOT DETECTED Final    Comment: (NOTE) This CT/NG assay has not been evaluated in patients with Arnet Hofferber history of  hysterectomy. Performed at [facility], [address] [place], [place] [address]           Radiology Studies: DG Chest 2 View  Result Date: 06/25/2021 CLINICAL DATA:  Pain with fever and sore throat EXAM: CHEST - 2 VIEW COMPARISON:  June 24, 2021 FINDINGS: The lungs are clear. Heart size and pulmonary vascularity are normal. No adenopathy. No pneumothorax. No bone lesions. IMPRESSION: Lungs clear.  Cardiac silhouette normal. Electronically Signed   By: Bretta Bang III M.D.   On: 06/25/2021 12:58   DG Chest 2 View  Result Date: 06/24/2021 CLINICAL DATA:  Cough and fevers EXAM: CHEST - 2 VIEW COMPARISON:  02/07/2019 FINDINGS: The heart size and mediastinal contours are within normal limits. Both lungs are clear. The visualized skeletal structures are unremarkable. IMPRESSION: No active cardiopulmonary disease. Electronically Signed   By: Alcide Clever M.D.   On: 06/24/2021 16:56   CT Soft Tissue Neck W Contrast  Result Date: 06/25/2021 CLINICAL DATA:  Sore throat, positive blood cultures EXAM: CT NECK WITH CONTRAST TECHNIQUE: Multidetector CT imaging of the neck was performed using the standard protocol following the bolus administration of intravenous contrast. CONTRAST:  32mL OMNIPAQUE IOHEXOL 300 MG/ML  SOLN COMPARISON:  None. FINDINGS: Pharynx and larynx: Prominence of the palatine tonsils, right greater than left, with heterogeneity but no peritonsillar abscess. Pharynx is otherwise unremarkable. The airway is patent. Salivary glands: Parotid and submandibular glands are unremarkable. Thyroid: Head Lymph nodes:  Asymmetric enlarged 2.2 cm right level 2 node is likely reactive. Vascular: Major neck vessels are patent. Limited intracranial: No abnormal enhancement. Visualized orbits: Unremarkable. Mastoids and visualized paranasal sinuses: Patchy mucosal thickening. Mastoid air cells are clear. 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 No acute abnormality. Upper chest: Included upper  lungs are clear. Other: None. IMPRESSION: Palatine tonsillitis without abscess. Reactive right level 2 adenopathy. Electronically Signed   By: Guadlupe Spanish M.D.   On: 06/25/2021 14:29   CT ABDOMEN PELVIS W CONTRAST  Result Date: 06/24/2021 CLINICAL DATA:  Right lower quadrant abdominal pain, fever, emesis since "Sunday EXAM: CT ABDOMEN AND PELVIS WITH CONTRAST TECHNIQUE: Multidetector CT imaging of the abdomen and pelvis was performed using the standard protocol following bolus administration of intravenous contrast. CONTRAST:  100mL OMNIPAQUE IOHEXOL 300 MG/ML  SOLN COMPARISON:  07/19/2016 FINDINGS: Lower chest: Lung bases are clear. Normal heart size. No pericardial effusion. Hepatobiliary: No worrisome focal liver abnormality is seen. Normal gallbladder. No visible calcified gallstones. No biliary ductal dilatation. Pancreas: No pancreatic ductal dilatation or surrounding inflammatory changes. Spleen: Normal in size. No concerning splenic lesions. Adrenals/Urinary Tract: Normal adrenal glands. Kidneys are normally located with symmetric enhancement. Scattered subcentimeter hypertension foci in both kidneys too small to fully characterize on CT imaging but statistically likely benign. No suspicious renal lesion, urolithiasis or hydronephrosis. Urinary bladder is unremarkable for the degree of distention Stomach/Bowel: Distal esophagus, stomach and duodenal sweep are unremarkable. No small bowel wall thickening or dilatation. No evidence of obstruction. Noninflamed appendix in the right lower quadrant. No colonic dilatation or wall thickening. Vascular/Lymphatic: No significant vascular findings are present. No enlarged abdominal or pelvic lymph nodes. Reproductive: The prostate and seminal vesicles are unremarkable. No acute abnormality of the external genitalia. Other: No abdominopelvic free fluid or free gas. No bowel containing hernias. Musculoskeletal: No acute osseous abnormality or suspicious osseous  lesion. IMPRESSION: No acute CT abnormality to provide cause patient's abdominal symptoms. Specifically, the appendix is normal. Electronically Signed   By: Price  DeHay M.D.   On: 06/24/2021 15:27   ECHOCARDIOGRAM COMPLETE  Result Date: 06/26/2021    ECHOCARDIOGRAM REPORT   Patient Name:   Larell Morrisette Date of Exam: 06/26/2021 Medical Rec #:  [id number]     Height:       75.0 in Accession #:    [id number]    Weight:       209.4 lb Date of Birth:  02/09/1996      BSA:          2.238 m Patient Age:    25 years      BP:           130/80 mmHg Patient Gender: M             HR:           70"  bpm. Exam Location:  [facility] Procedure: 2D Echo, Cardiac Doppler and Color Doppler Indications:     Bacteremia R 78.81  History:         Patient has no prior history of Echocardiogram examinations.                  Signs/Symptoms:Murmur.  Sonographer:     RDCS (AE) Referring Phys:  Cristela Blue AMY N COX Diagnosing Phys: [id number] MD  Sonographer Comments: Suboptimal apical window. IMPRESSIONS  1. Left ventricular ejection fraction, by estimation, is 60  to 65%. The left ventricle has normal function. The left ventricle has no regional wall motion abnormalities. Left ventricular diastolic parameters were normal.  2. Right ventricular systolic function is normal. The right ventricular size is not well visualized.  3. The mitral valve is normal in structure. No evidence of mitral valve regurgitation. No evidence of mitral stenosis.  4. The aortic valve is normal in structure. Aortic valve regurgitation is not visualized. No aortic stenosis is present. FINDINGS  Left Ventricle: Left ventricular ejection fraction, by estimation, is 60 to 65%. The left ventricle has normal function. The left ventricle has no regional wall motion abnormalities. The left ventricular internal cavity size was normal in size. There is  no left ventricular hypertrophy. Left ventricular diastolic parameters were normal. Right Ventricle: The right  ventricular size is not well visualized. No increase in right ventricular wall thickness. Right ventricular systolic function is normal. Left Atrium: Left atrial size was normal in size. Right Atrium: Right atrial size was normal in size. Pericardium: There is no evidence of pericardial effusion. Mitral Valve: The mitral valve is normal in structure. No evidence of mitral valve regurgitation. No evidence of mitral valve stenosis. Tricuspid Valve: The tricuspid valve is normal in structure. Tricuspid valve regurgitation is not demonstrated. No evidence of tricuspid stenosis. Aortic Valve: The aortic valve is normal in structure. Aortic valve regurgitation is not visualized. No aortic stenosis is present. Aortic valve mean gradient measures 5.0 mmHg. Aortic valve peak gradient measures 9.7 mmHg. Aortic valve area, by VTI measures 2.72 cm. Pulmonic Valve: The pulmonic valve was normal in structure. Pulmonic valve regurgitation is not visualized. No evidence of pulmonic stenosis. Aorta: The aortic root is normal in size and structure. Venous: The inferior vena cava was not well visualized. IAS/Shunts: No atrial level shunt detected by color flow Doppler.  LEFT VENTRICLE PLAX 2D LVIDd:         4.27 cm  Diastology LVIDs:         2.77 cm  LV e' medial:    8.81 cm/s LV PW:         1.33 cm  LV E/e' medial:  10.8 LV IVS:        1.14 cm  LV e' lateral:   8.70 cm/s LVOT diam:     2.10 cm  LV E/e' lateral: 11.0 LV SV:         62 LV SV Index:   28 LVOT Area:     3.46 cm  RIGHT VENTRICLE RV Basal diam:  4.68 cm LEFT ATRIUM             Index       RIGHT ATRIUM           Index LA diam:        3.20 cm 1.43 cm/m  RA Area:     16.50 cm LA Vol (A2C):   55.7 ml 24.89 ml/m RA Volume:   41.30 ml  18.46 ml/m LA Vol (A4C):   29.7 ml 13.27 ml/m LA Biplane Vol: 40.4 ml 18.05 ml/m  AORTIC VALVE                    PULMONIC VALVE AV Area (Vmax):    2.84 cm     PV Vmax:        0.94 m/s AV Area (Vmean):   2.75 cm     PV Peak grad:   3.6  mmHg AV Area (VTI):     2.72 cm  RVOT Peak grad: 3 mmHg AV Vmax:           156.00 cm/s AV Vmean:          100.000 cm/s AV VTI:            0.229 m AV Peak Grad:      9.7 mmHg AV Mean Grad:      5.0 mmHg LVOT Vmax:         128.00 cm/s LVOT Vmean:        79.500 cm/s LVOT VTI:          0.180 m LVOT/AV VTI ratio: 0.79  AORTA Ao Root diam: 3.10 cm MITRAL VALVE               TRICUSPID VALVE MV Area (PHT): 3.93 cm    TR Peak grad:   6.0 mmHg MV Decel Time: 193 msec    TR Vmax:        122.00 cm/s MV E velocity: 95.50 cm/s MV Rhyker Silversmith velocity: 57.80 cm/s  SHUNTS MV E/Margaret Staggs ratio:  1.65        Systemic VTI:  0.18 m                            Systemic Diam: 2.10 cm Debbe Odea MD Electronically signed by Debbe Odea MD Signature Date/Time: 06/26/2021/11:55:34 AM    Final         Scheduled Meds:  enoxaparin (LOVENOX) injection  40 mg Subcutaneous Q24H   Continuous Infusions:  ampicillin-sulbactam (UNASYN) IV Stopped (06/26/21 1256)   promethazine (PHENERGAN) injection (IM or IVPB) Stopped (06/26/21 1122)     LOS: 0 days    Time spent: over 30 min    Lacretia Nicks, MD [facility]   To contact the attending provider between 7A-7P or the covering provider during after hours 7P-7A, please log into the web site [email or web address] and access using universal [place] password for that web site. If you do not have the password, please call the hospital operator.  06/26/2021, 2:21 PM

## 2021-06-26 NOTE — Progress Notes (Signed)
Date of Admission:  06/25/2021       Subjective: Pt doing better Pain throat better Fever of 101 But he says it could have been due to him lying under blankets Wants to go home  Medications:   enoxaparin (LOVENOX) injection  40 mg Subcutaneous Q24H    Objective: Vital signs in last 24 hours: Temp:  [98.5 F (36.9 C)-101.7 F (38.7 C)] 101.7 F (38.7 C) (06/30 1300) Pulse Rate:  [62-86] 86 (06/30 1300) Resp:  [18-20] 20 (06/30 1300) BP: (100-132)/(61-80) 100/61 (06/30 1300) SpO2:  [97 %-100 %] 97 % (06/30 1300)  PHYSICAL EXAM:  General: Alert, cooperative, no distress, appears stated age.  Head: Normocephalic, without obvious abnormality, atraumatic. Eyes: Conjunctivae clear, anicteric sclerae. Pupils are equal ENT pustular tonsilitis Neck: Supple, symmetrical, no adenopathy, thyroid: non tender no carotid bruit and no JVD. Back: No CVA tenderness. Lungs: Clear to auscultation bilaterally. No Wheezing or Rhonchi. No rales. Heart: Regular rate and rhythm, no murmur, rub or gallop. Abdomen: Soft, non-tender,not distended. Bowel sounds normal. No masses Extremities: atraumatic, no cyanosis. No edema. No clubbing Skin: No rashes or lesions. Or bruising Lymph: Cervical, supraclavicular normal. Neurologic: Grossly non-focal  Lab Results Recent Labs    06/25/21 1204 06/26/21 0507  WBC 11.1* 9.3  HGB 14.1 13.4  HCT 39.2 38.4*  NA 135 139  K 3.3* 3.1*  CL 103 102  CO2 26 30  BUN 8 7  CREATININE 0.72 0.91   Liver Panel Recent Labs    06/24/21 1229 06/25/21 1204  PROT 7.3 7.5  ALBUMIN 4.0 3.7  AST 17 18  ALT 17 14  ALKPHOS 64 59  BILITOT 1.0 0.7   Sedimentation Rate No results for input(s): ESRSEDRATE in the last 72 hours. C-Reactive Protein No results for input(s): CRP in the last 72 hours.  Microbiology:  Studies/Results: DG Chest 2 View  Result Date: 06/25/2021 CLINICAL DATA:  Pain with fever and sore throat EXAM: CHEST - 2 VIEW COMPARISON:   June 24, 2021 FINDINGS: The lungs are clear. Heart size and pulmonary vascularity are normal. No adenopathy. No pneumothorax. No bone lesions. IMPRESSION: Lungs clear.  Cardiac silhouette normal. Electronically Signed   By: Bretta Bang III M.D.   On: 06/25/2021 12:58   DG Chest 2 View  Result Date: 06/24/2021 CLINICAL DATA:  Cough and fevers EXAM: CHEST - 2 VIEW COMPARISON:  02/07/2019 FINDINGS: The heart size and mediastinal contours are within normal limits. Both lungs are clear. The visualized skeletal structures are unremarkable. IMPRESSION: No active cardiopulmonary disease. Electronically Signed   By: Alcide Clever M.D.   On: 06/24/2021 16:56   CT Soft Tissue Neck W Contrast  Result Date: 06/25/2021 CLINICAL DATA:  Sore throat, positive blood cultures EXAM: CT NECK WITH CONTRAST TECHNIQUE: Multidetector CT imaging of the neck was performed using the standard protocol following the bolus administration of intravenous contrast. CONTRAST:  28mL OMNIPAQUE IOHEXOL 300 MG/ML  SOLN COMPARISON:  None. FINDINGS: Pharynx and larynx: Prominence of the palatine tonsils, right greater than left, with heterogeneity but no peritonsillar abscess. Pharynx is otherwise unremarkable. The airway is patent. Salivary glands: Parotid and submandibular glands are unremarkable. Thyroid: Head Lymph nodes: Asymmetric enlarged 2.2 cm right level 2 node is likely reactive. Vascular: Major neck vessels are patent. Limited intracranial: No abnormal enhancement. Visualized orbits: Unremarkable. Mastoids and visualized paranasal sinuses: Patchy mucosal thickening. Mastoid air cells are clear. 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 No acute abnormality. Upper chest: Included upper lungs are clear. Other: None. IMPRESSION: Palatine tonsillitis without abscess. Reactive right level 2 adenopathy. Electronically Signed   By: Guadlupe Spanish M.D.   On: 06/25/2021 14:29   ECHOCARDIOGRAM COMPLETE  Result Date: 06/26/2021    ECHOCARDIOGRAM REPORT   Patient Name:   Wayne Short Date of Exam: 06/26/2021 Medical Rec #:  [id number]     Height:       75.0 in Accession #:    [id number]    Weight:       209.4 lb Date of Birth:  03-02-1996      BSA:          2.238 m Patient Age:    25 years      BP:           130/80 mmHg Patient Gender: M             HR:           70 bpm. Exam Location:  [facility] Procedure: 2D Echo, Cardiac Doppler and Color Doppler Indications:     Bacteremia R 78.81  History:         Patient has no prior history of Echocardiogram examinations.                  Signs/Symptoms:Murmur.  Sonographer:     Cristela Blue RDCS (AE) Referring Phys:  [id number] AMY N COX Diagnosing Phys: Debbe Odea MD  Sonographer Comments: Suboptimal apical window. IMPRESSIONS  1. Left ventricular ejection fraction, by estimation, is 60 to 65%. The left ventricle has normal function. The left ventricle has no regional wall motion abnormalities. Left ventricular diastolic parameters were normal.  2. Right ventricular systolic function is normal. The right ventricular size is not well visualized.  3. The mitral valve is normal in structure. No evidence of mitral valve regurgitation. No evidence of mitral stenosis.  4. The aortic valve is normal in structure. Aortic valve regurgitation is not visualized. No aortic stenosis is present. FINDINGS  Left Ventricle: Left ventricular ejection fraction, by estimation, is 60 to 65%. The left ventricle has normal function. The left ventricle has no regional wall motion abnormalities. The left ventricular internal cavity size was normal in size. There is  no left ventricular hypertrophy. Left ventricular diastolic parameters were normal. Right Ventricle: The right ventricular size is not well visualized. No increase in right ventricular wall thickness. Right ventricular systolic function is normal. Left Atrium: Left atrial size  was normal in size. Right Atrium: Right atrial size was normal in size. Pericardium: There is no evidence of pericardial effusion. Mitral Valve: The mitral valve is normal in structure. No evidence of mitral valve regurgitation. No evidence of mitral valve stenosis. Tricuspid Valve: The tricuspid valve is normal in structure. Tricuspid valve regurgitation is not demonstrated. No evidence of tricuspid stenosis. Aortic Valve: The aortic valve is normal in structure. Aortic valve regurgitation is not visualized. No aortic stenosis is present. Aortic valve mean gradient measures 5.0 mmHg. Aortic valve peak gradient measures 9.7 mmHg. Aortic valve area, by VTI measures 2.72 cm. Pulmonic Valve: The pulmonic valve was normal in structure. Pulmonic valve regurgitation is not visualized. No evidence of pulmonic stenosis. Aorta: The aortic root is normal in size and structure. Venous: The inferior vena cava was not well visualized. IAS/Shunts: No atrial level shunt detected by color flow Doppler.  LEFT VENTRICLE PLAX 2D LVIDd:  4.27 cm  Diastology LVIDs:         2.77 cm  LV e' medial:    8.81 cm/s LV PW:         1.33 cm  LV E/e' medial:  10.8 LV IVS:        1.14 cm  LV e' lateral:   8.70 cm/s LVOT diam:     2.10 cm  LV E/e' lateral: 11.0 LV SV:         62 LV SV Index:   28 LVOT Area:     3.46 cm  RIGHT VENTRICLE RV Basal diam:  4.68 cm LEFT ATRIUM             Index       RIGHT ATRIUM           Index LA diam:        3.20 cm 1.43 cm/m  RA Area:     16.50 cm LA Vol (A2C):   55.7 ml 24.89 ml/m RA Volume:   41.30 ml  18.46 ml/m LA Vol (A4C):   29.7 ml 13.27 ml/m LA Biplane Vol: 40.4 ml 18.05 ml/m  AORTIC VALVE                    PULMONIC VALVE AV Area (Vmax):    2.84 cm     PV Vmax:        0.94 m/s AV Area (Vmean):   2.75 cm     PV Peak grad:   3.6 mmHg AV Area (VTI):     2.72 cm     RVOT Peak grad: 3 mmHg AV Vmax:           156.00 cm/s AV Vmean:          100.000 cm/s AV VTI:            0.229 m AV Peak Grad:       9.7 mmHg AV Mean Grad:      5.0 mmHg LVOT Vmax:         128.00 cm/s LVOT Vmean:        79.500 cm/s LVOT VTI:          0.180 m LVOT/AV VTI ratio: 0.79  AORTA Ao Root diam: 3.10 cm MITRAL VALVE               TRICUSPID VALVE MV Area (PHT): 3.93 cm    TR Peak grad:   6.0 mmHg MV Decel Time: 193 msec    TR Vmax:        122.00 cm/s MV E velocity: 95.50 cm/s MV A velocity: 57.80 cm/s  SHUNTS MV E/A ratio:  1.65        Systemic VTI:  0.18 m                            Systemic Diam: 2.10 cm Debbe Odea MD Electronically signed by Debbe Odea MD Signature Date/Time: 06/26/2021/11:55:34 AM    Final      Assessment/Plan: Purulent tonsillitis- Ct neck did not show any peritonsillar abscess- strep neg, mono neg, GC/Chl neg, HIV NR On unasyn On discharge switch to augmentin for 7 more days  Staph epidermidis bacteremia is contaminant Repeat culture from 06/25/21 negative  Marijuana use- excess- causing nausea vomiting- talked to him about that Discussed the management with the hospitalist

## 2021-06-26 NOTE — Discharge Summary (Signed)
Physician Discharge Summary  Wayne Short UUV:[id number] DOB: [birth year] DOA: 06/25/2021  PCP: Patient, No Pcp Per (Inactive)  Admit date: 06/25/2021 Discharge date: 06/26/2021  Time spent: 40 minutes  Recommendations for Outpatient Follow-up:  Follow outpatient CBC/CMP Follow final cultures outpatient Follow symptoms outpatient, w/u additionally as needed Initial UA with micoscopic hematuria, pyuria - repeat here normal, follow outpatient     Discharge Diagnoses:  Active Problems:   Gram-positive bacteremia   Hypokalemia   Sepsis (HCC)   Fever   Discharge Condition: stable  Diet recommendation: heart healthy  Filed Weights   06/25/21 1202 06/25/21 1207  Weight: 95.3 kg 95 kg    History of present illness:  Wayne Short is Wayne Short 25 y.o. male with no previous medical history significant presents to the emergency department for chief concerns of bacteremia.  He was seen for N/V and abdominal discomfort.  He had imaging finding concerning for tonsillitis.  Bacteremia is felt to be contaminant.  ID following.  He requested discharge on 6/30 and was discharged with recommendations to follow outpatient.   Hospital Course:  Tonsillitis  Fever  Nausea  Vomiting  Abdominal Discomfort Fever and constellation of sx, related to tonsillitis - ? Viral vs bacterial Negative monospot, group Wayne Short strep PCR, gonorrhea/chlamydia, and HIV Appreciate ID assistance, continue unasyn - d/c on augmentin  Recurrent fevers persistent today Symptomatic management of nausea, abdominal discomfort with analgesics/antiemetics   Coagulase Negative Staph Bacteremia Likely contaminant, growing staph hominis and staph epi Repeat cultures currently NGTD Echo with normal MV, AV - see report   Pyuria  Microscopic Hematuria Negative urine gc/chlam 6/28 urine studes notable for hematuria/pyuria -> resolved at this hospitalization UA not concerning for UTI at this admission Urine cx with multiple  species Follow outpatient     Procedures: Echo IMPRESSIONS     1. Left ventricular ejection fraction, by estimation, is 60 to 65%. The  left ventricle has normal function. The left ventricle has no regional  wall motion abnormalities. Left ventricular diastolic parameters were  normal.   2. Right ventricular systolic function is normal. The right ventricular  size is not well visualized.   3. The mitral valve is normal in structure. No evidence of mitral valve  regurgitation. No evidence of mitral stenosis.   4. The aortic valve is normal in structure. Aortic valve regurgitation is  not visualized. No aortic stenosis is present.  Consultations: ID  Discharge Exam: Vitals:   06/26/21 1600 06/26/21 1633  BP:  114/66  Pulse:  76  Resp:  20  Temp: 98.1 F (36.7 C) 99.1 F (37.3 C)  SpO2:  98%   Asking to discharge See previous note for exam  Recommended outpatient PCP follow up  Discharge Instructions   Discharge Instructions     Call MD for:  difficulty breathing, headache or visual disturbances   Complete by: As directed    Call MD for:  extreme fatigue   Complete by: As directed    Call MD for:  hives   Complete by: As directed    Call MD for:  persistant dizziness or light-headedness   Complete by: As directed    Call MD for:  persistant nausea and vomiting   Complete by: As directed    Call MD for:  redness, tenderness, or signs of infection (pain, swelling, redness, odor or green/yellow discharge around incision site)   Complete by: As directed    Call MD for:  severe uncontrolled pain   Complete by: As  directed    Call MD for:  temperature >100.4   Complete by: As directed    Diet - low sodium heart healthy   Complete by: As directed    Discharge instructions   Complete by: As directed    You were seen for bacteremia.  We think this is Wayne Short contaminant.  You have pending blood cultures.  Please follow up with your PCP outpatient to follow these  results.  We'll treat you with augmentin for 7 days for your tonsillitis.    Return for new, recurrent, or worsening symptoms.  Please ask your PCP to request records from this hospitalization so they know what was done and what the next steps will be.   Increase activity slowly   Complete by: As directed       Allergies as of 06/26/2021   No Known Allergies      Medication List     STOP taking these medications    benzonatate 100 MG capsule Commonly known as: TESSALON   cefdinir 300 MG capsule Commonly known as: OMNICEF   cyclobenzaprine 10 MG tablet Commonly known as: FLEXERIL   dicyclomine 20 MG tablet Commonly known as: BENTYL   doxycycline 100 MG capsule Commonly known as: VIBRAMYCIN   famotidine 20 MG tablet Commonly known as: PEPCID   metoCLOPramide 10 MG tablet Commonly known as: REGLAN   naproxen 500 MG tablet Commonly known as: NAPROSYN   omeprazole 20 MG capsule Commonly known as: PRILOSEC   promethazine 25 MG tablet Commonly known as: PHENERGAN       TAKE these medications    amoxicillin-clavulanate 875-125 MG tablet Commonly known as: Augmentin Take 1 tablet by mouth 2 (two) times daily for 7 days.   ondansetron 4 MG disintegrating tablet Commonly known as: Zofran ODT Take 1 tablet (4 mg total) by mouth every 8 (eight) hours as needed for nausea or vomiting.   polyethylene glycol powder 17 GM/SCOOP powder Commonly known as: MiraLax Take 3 capfuls for day 1 and 2. On day 3 start taking 1 capful 3 times Wayne Short day. Slowly cut back as needed until you have normal bowel movements   sucralfate 1 g tablet Commonly known as: CARAFATE Take 1 tablet (1 g total) by mouth 4 (four) times daily -  with meals and at bedtime for 14 days.       No Known Allergies    The results of significant diagnostics from this hospitalization (including imaging, microbiology, ancillary and laboratory) are listed below for reference.    Significant  Diagnostic Studies: DG Chest 2 View  Result Date: 06/25/2021 CLINICAL DATA:  Pain with fever and sore throat EXAM: CHEST - 2 VIEW COMPARISON:  June 24, 2021 FINDINGS: The lungs are clear. Heart size and pulmonary vascularity are normal. No adenopathy. No pneumothorax. No bone lesions. IMPRESSION: Lungs clear.  Cardiac silhouette normal. Electronically Signed   By: Bretta Bang III M.D.   On: 06/25/2021 12:58   DG Chest 2 View  Result Date: 06/24/2021 CLINICAL DATA:  Cough and fevers EXAM: CHEST - 2 VIEW COMPARISON:  02/07/2019 FINDINGS: The heart size and mediastinal contours are within normal limits. Both lungs are clear. The visualized skeletal structures are unremarkable. IMPRESSION: No active cardiopulmonary disease. Electronically Signed   By: Alcide Clever M.D.   On: 06/24/2021 16:56   CT Soft Tissue Neck W Contrast  Result Date: 06/25/2021 CLINICAL DATA:  Sore throat, positive blood cultures EXAM: CT NECK WITH CONTRAST TECHNIQUE: Multidetector CT imaging of  the neck was performed using the standard protocol following the bolus administration of intravenous contrast. CONTRAST:  75mL OMNIPAQUE IOHEXOL 300 MG/ML  SOLN COMPARISON:  None. FINDINGS: Pharynx and larynx: Prominence of the palatine tonsils, right greater than left, with heterogeneity but no peritonsillar abscess. Pharynx is otherwise unremarkable. The airway is patent. Salivary glands: Parotid and submandibular glands are unremarkable. Thyroid: Head Lymph nodes: Asymmetric enlarged 2.2 cm right level 2 node is likely reactive. Vascular: Major neck vessels are patent. Limited intracranial: No abnormal enhancement. Visualized orbits: Unremarkable. Mastoids and visualized paranasal sinuses: Patchy mucosal thickening. Mastoid air cells are clear. 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 No acute abnormality. Upper chest: Included upper lungs are  clear. Other: None. IMPRESSION: Palatine tonsillitis without abscess. Reactive right level 2 adenopathy. Electronically Signed   By: Guadlupe Spanish M.D.   On: 06/25/2021 14:29   CT ABDOMEN PELVIS W CONTRAST  Result Date: 06/24/2021 CLINICAL DATA:  Right lower quadrant abdominal pain, fever, emesis since  bpm. Exam Location:  [facility] Procedure: 2D Echo, Cardiac Doppler and Color Doppler Indications:     Bacteremia R 78.81  History:  Patient has no prior history of Echocardiogram examinations.                  Signs/Symptoms:Murmur.  Sonographer:     Cristela Blue RDCS (AE) Referring Phys:  [id number] AMY N COX Diagnosing Phys: Debbe Odea MD  Sonographer Comments: Suboptimal apical window. IMPRESSIONS  1. Left ventricular ejection fraction, by estimation, is 60 to 65%. The left ventricle has normal function. The left ventricle has no regional wall motion abnormalities. Left ventricular diastolic parameters were normal.  2. Right ventricular systolic function is normal. The right ventricular size is not well visualized.  3. The mitral valve is normal in structure. No evidence of mitral valve regurgitation. No evidence of mitral stenosis.  4. The aortic valve is normal in structure. Aortic valve regurgitation is not visualized. No aortic stenosis is present. FINDINGS  Left Ventricle: Left ventricular ejection fraction, by estimation, is 60 to 65%. The left ventricle has normal function. The left ventricle has no regional wall motion abnormalities. The left ventricular internal cavity size was normal in size. There is  no left ventricular hypertrophy. Left ventricular diastolic parameters were normal. Right Ventricle: The right ventricular  size is not well visualized. No increase in right ventricular wall thickness. Right ventricular systolic function is normal. Left Atrium: Left atrial size was normal in size. Right Atrium: Right atrial size was normal in size. Pericardium: There is no evidence of pericardial effusion. Mitral Valve: The mitral valve is normal in structure. No evidence of mitral valve regurgitation. No evidence of mitral valve stenosis. Tricuspid Valve: The tricuspid valve is normal in structure. Tricuspid valve regurgitation is not demonstrated. No evidence of tricuspid stenosis. Aortic Valve: The aortic valve is normal in structure. Aortic valve regurgitation is not visualized. No aortic stenosis is present. Aortic valve mean gradient measures 5.0 mmHg. Aortic valve peak gradient measures 9.7 mmHg. Aortic valve area, by VTI measures 2.72 cm. Pulmonic Valve: The pulmonic valve was normal in structure. Pulmonic valve regurgitation is not visualized. No evidence of pulmonic stenosis. Aorta: The aortic root is normal in size and structure. Venous: The inferior vena cava was not well visualized. IAS/Shunts: No atrial level shunt detected by color flow Doppler.  LEFT VENTRICLE PLAX 2D LVIDd:         4.27 cm  Diastology LVIDs:         2.77 cm  LV e' medial:    8.81 cm/s LV PW:         1.33 cm  LV E/e' medial:  10.8 LV IVS:        1.14 cm  LV e' lateral:   8.70 cm/s LVOT diam:     2.10 cm  LV E/e' lateral: 11.0 LV SV:         62 LV SV Index:   28 LVOT Area:     3.46 cm  RIGHT VENTRICLE RV Basal diam:  4.68 cm LEFT ATRIUM             Index       RIGHT ATRIUM           Index LA diam:        3.20 cm 1.43 cm/m  RA Area:     16.50 cm LA Vol (A2C):   55.7 ml 24.89 ml/m RA Volume:   41.30 ml  18.46 ml/m LA Vol (A4C):   29.7 ml 13.27 ml/m LA Biplane Vol: 40.4 ml 18.05 ml/m  AORTIC VALVE  PULMONIC VALVE AV Area (Vmax):    2.84 cm     PV Vmax:        0.94 m/s AV Area (Vmean):   2.75 cm     PV Peak grad:   3.6 mmHg AV Area  (VTI):     2.72 cm     RVOT Peak grad: 3 mmHg AV Vmax:           156.00 cm/s AV Vmean:          100.000 cm/s AV VTI:            0.229 m AV Peak Grad:      9.7 mmHg AV Mean Grad:      5.0 mmHg LVOT Vmax:         128.00 cm/s LVOT Vmean:        79.500 cm/s LVOT VTI:          0.180 m LVOT/AV VTI ratio: 0.79  AORTA Ao Root diam: 3.10 cm MITRAL VALVE               TRICUSPID VALVE MV Area (PHT): 3.93 cm    TR Peak grad:   6.0 mmHg MV Decel Time: 193 msec    TR Vmax:        122.00 cm/s MV E velocity: 95.50 cm/s MV Mayari Matus velocity: 57.80 cm/s  SHUNTS MV E/Shawntia Mangal ratio:  1.65        Systemic VTI:  0.18 m                            Systemic Diam: 2.10 cm Debbe Odea MD Electronically signed by Debbe Odea MD Signature Date/Time: 06/26/2021/11:55:34 AM    Final     Microbiology: Recent Results (from the past 240 hour(s))  Group Shekelia Boutin Strep by PCR ([facility] Only)     Status: None   Collection Time: 06/24/21  2:50 PM   Specimen: Throat; Sterile Swab  Result Value Ref Range Status   Group Symeon Puleo Strep by PCR NOT DETECTED NOT DETECTED Final    Comment: Performed at [facility], [address] [place], [place] [address]  Resp Panel by RT-PCR (Flu Dossie Ocanas&B, Covid) Throat     Status: None   Collection Time: 06/24/21  2:50 PM   Specimen: Throat; Nasopharyngeal(NP) swabs in vial transport medium  Result Value Ref Range Status   SARS Coronavirus 2 by RT PCR NEGATIVE NEGATIVE Final    Comment: (NOTE) SARS-CoV-2 target nucleic acids are NOT DETECTED.  The SARS-CoV-2 RNA is generally detectable in upper respiratory specimens during the acute phase of infection. The lowest concentration of SARS-CoV-2 viral copies this assay can detect is 138 copies/mL. Croix Presley negative result does not preclude SARS-Cov-2 infection and should not be used as the sole basis for treatment or other patient management decisions. Cyndie Woodbeck negative result may occur with  improper specimen collection/handling, submission of specimen other than nasopharyngeal  swab, presence of viral mutation(s) within the areas targeted by this assay, and inadequate number of viral copies(<138 copies/mL). Marylynn Rigdon negative result must be combined with clinical observations, patient history, and epidemiological information. The expected result is Negative.  Fact Sheet for Patients:  BloggerCourse.com  Fact Sheet for Healthcare Providers:  SeriousBroker.it  This test is no t yet approved or cleared by the [place] FDA and  has been authorized for detection and/or diagnosis of SARS-CoV-2 by FDA under an Emergency Use Authorization (EUA). This EUA will remain  in effect (meaning this test can be used) for the duration of the COVID-19 declaration under Section 564(b)(1) of the Act, 21 U.S.C.section 360bbb-3(b)(1), unless the authorization is terminated  or revoked sooner.       Influenza Anola Mcgough by PCR NEGATIVE NEGATIVE Final   Influenza B by PCR NEGATIVE NEGATIVE Final    Comment: (NOTE) The Xpert Xpress SARS-CoV-2/FLU/RSV plus assay is intended as an aid in the diagnosis of influenza from Nasopharyngeal swab specimens and should not be used as Harriette Tovey sole basis for treatment. Nasal washings and aspirates are unacceptable for Xpert Xpress SARS-CoV-2/FLU/RSV testing.  Fact Sheet for Patients: BloggerCourse.com  Fact Sheet for Healthcare Providers: SeriousBroker.it  This test is not yet approved or cleared by the [place] FDA and has been authorized for detection and/or diagnosis of SARS-CoV-2 by FDA under an Emergency Use Authorization (EUA). This EUA will remain in effect (meaning this test can be used) for the duration of the COVID-19 declaration under Section 564(b)(1) of the Act, 21 U.S.C. section 360bbb-3(b)(1), unless the authorization is terminated or revoked.  Performed at [facility], [address], [place], [place] [address]   Urine  culture     Status: Abnormal   Collection Time: 06/24/21  3:50 PM   Specimen: Urine, Random  Result Value Ref Range Status   Specimen Description   Final    URINE, RANDOM Performed at [facility], [address], [place], [place] [address]    Special Requests   Final    NONE Performed at [facility], [address] [place], [place] [address]    Culture MULTIPLE SPECIES PRESENT, SUGGEST RECOLLECTION (Federica Allport)  Final   Report Status 06/26/2021 FINAL  Final  Blood culture (routine x 2)     Status: Abnormal (Preliminary result)   Collection Time: 06/24/21  7:21 PM   Specimen: BLOOD  Result Value Ref Range Status   Specimen Description   Final    BLOOD LEFT ANTECUBITAL Performed at [facility], [address], [place], [place] [address]    Special Requests   Final    BOTTLES DRAWN AEROBIC AND ANAEROBIC Blood Culture adequate volume Performed at [facility], [address], [place], [place] [address]    Culture  Setup Time   Final    GRAM POSITIVE COCCI IN BOTH AEROBIC AND ANAEROBIC BOTTLES CRITICAL RESULT CALLED TO, READ BACK BY AND VERIFIED WITH: [facility] HAMILTON 06/25/21 1025 KLW Performed at [facility], [address] [place], [place] [address]    Culture STAPHYLOCOCCUS EPIDERMIDIS (Keyonta Madrid)  Final   Report Status PENDING  Incomplete  Blood culture (routine x 2)     Status: Abnormal (Preliminary result)   Collection Time: 06/24/21  7:21 PM   Specimen: BLOOD  Result Value Ref Range Status   Specimen Description   Final    BLOOD BLOOD LEFT FOREARM Performed at [facility], [address] [place], [place] [address]    Special Requests   Final    BOTTLES DRAWN AEROBIC AND ANAEROBIC Blood Culture adequate volume Performed at [facility], [address] [place], [place] [address]    Culture  Setup Time   Final    IN BOTH AEROBIC AND ANAEROBIC BOTTLES GRAM POSITIVE COCCI CRITICAL RESULT CALLED TO, READ BACK  BY AND VERIFIED WITH: SAMANTHA HAMILTON 06/25/21 1025 KLW    Culture (Taquita Demby)  Final    STAPHYLOCOCCUS HOMINIS STAPHYLOCOCCUS EPIDERMIDIS SUSCEPTIBILITIES TO FOLLOW Performed at [facility], [address] [place], [place] [address]    Report Status PENDING  Incomplete  Blood Culture ID Panel (Reflexed)     Status: Abnormal   Collection Time: 06/24/21  7:21 PM  Result Value Ref Range Status   Enterococcus faecalis NOT DETECTED NOT DETECTED Final   Enterococcus Faecium NOT DETECTED NOT DETECTED Final   Listeria monocytogenes NOT DETECTED NOT DETECTED Final   Staphylococcus species DETECTED (Gilman Olazabal) NOT DETECTED Final    Comment: CRITICAL RESULT CALLED TO, READ BACK BY AND VERIFIED WITH: SAMANTHA HAMILTON 06/25/21 1025 KLW    Staphylococcus aureus (BCID) NOT DETECTED NOT DETECTED Final   Staphylococcus epidermidis DETECTED (Ainhoa Rallo) NOT DETECTED Final    Comment: Methicillin (oxacillin) resistant coagulase negative staphylococcus. Possible blood culture contaminant (unless isolated from more than one blood culture draw or clinical case suggests pathogenicity). No antibiotic treatment is indicated for blood  culture contaminants. CRITICAL RESULT CALLED TO, READ BACK BY AND VERIFIED WITH: SAMANTHA HAMILTON 06/25/21 1025 KLW    Staphylococcus lugdunensis NOT DETECTED NOT DETECTED Final   Streptococcus species NOT DETECTED NOT DETECTED Final   Streptococcus agalactiae NOT DETECTED NOT DETECTED Final   Streptococcus pneumoniae NOT DETECTED NOT DETECTED Final   Streptococcus pyogenes NOT DETECTED NOT DETECTED Final   Seichi Kaufhold.calcoaceticus-baumannii NOT DETECTED NOT DETECTED Final   Bacteroides fragilis NOT DETECTED NOT DETECTED Final   Enterobacterales NOT DETECTED NOT DETECTED Final   Enterobacter cloacae complex NOT DETECTED NOT DETECTED Final   Escherichia coli NOT DETECTED NOT DETECTED Final   Klebsiella aerogenes NOT DETECTED NOT DETECTED Final   Klebsiella oxytoca NOT DETECTED NOT DETECTED Final    Klebsiella pneumoniae NOT DETECTED NOT DETECTED Final   Proteus species NOT DETECTED NOT DETECTED Final   Salmonella species NOT DETECTED NOT DETECTED Final   Serratia marcescens NOT DETECTED NOT DETECTED Final   Haemophilus influenzae NOT DETECTED NOT DETECTED Final   Neisseria meningitidis NOT DETECTED NOT DETECTED Final   Pseudomonas aeruginosa NOT DETECTED NOT DETECTED Final   Stenotrophomonas maltophilia NOT DETECTED NOT DETECTED Final   Candida albicans NOT DETECTED NOT DETECTED Final   Candida auris NOT DETECTED NOT DETECTED Final   Candida glabrata NOT DETECTED NOT DETECTED Final   Candida krusei NOT DETECTED NOT DETECTED Final   Candida parapsilosis NOT DETECTED NOT DETECTED Final   Candida tropicalis NOT DETECTED NOT DETECTED Final   Cryptococcus neoformans/gattii NOT DETECTED NOT DETECTED Final   Methicillin resistance mecA/C DETECTED (Haylen Bellotti) NOT DETECTED Final    Comment: CRITICAL RESULT CALLED TO, READ BACK BY AND VERIFIED WITH: SAMANTHA HAMILTON 06/25/21 1025 KLW Performed at [facility], [address] [place], [place] [address]   Culture, blood (Routine x 2)     Status: None (Preliminary result)   Collection Time: 06/25/21 12:05 PM   Specimen: Right Antecubital; Blood  Result Value Ref Range Status   Specimen Description RIGHT ANTECUBITAL  Final   Special Requests   Final    Blood Culture adequate volume BOTTLES DRAWN AEROBIC AND ANAEROBIC   Culture   Final    NO GROWTH < 24 HOURS Performed at [facility], [address] [place], [place] [address]    Report Status PENDING  Incomplete  Culture, blood (Routine x 2)     Status: None (Preliminary result)   Collection Time: 06/25/21  1:54 PM   Specimen: BLOOD  Result Value Ref Range Status   Specimen Description BLOOD LACTOSE FERMENTER  Final   Special Requests   Final    BOTTLES  DRAWN AEROBIC AND ANAEROBIC Blood Culture adequate volume   Culture   Final    NO GROWTH < 24 HOURS Performed at  [facility], [address] [place], [place] [address]    Report Status PENDING  Incomplete  Chlamydia/NGC rt PCR ([facility] only)     Status: None   Collection Time: 06/25/21  4:25 PM   Specimen: Throat  Result Value Ref Range Status   Specimen source GC/Chlam URINE, RANDOM  Final   Chlamydia Tr NOT DETECTED NOT DETECTED Final   N gonorrhoeae NOT DETECTED NOT DETECTED Final    Comment: (NOTE) This CT/NG assay has not been evaluated in patients with Anaiah Mcmannis history of  hysterectomy. Performed at [facility], [address] [place], [place] [address]   Group Brigitte Soderberg Strep by PCR     Status: None   Collection Time: 06/26/21 10:02 AM   Specimen: Sterile Swab  Result Value Ref Range Status   Group Darria Corvera Strep by PCR NOT DETECTED NOT DETECTED Final    Comment: Performed at [facility], [address] [place], [place] [address]  Chlamydia/NGC rt PCR [facility] only)     Status: None   Collection Time: 06/26/21 10:02 AM   Specimen: Oral Cytologic Material  Result Value Ref Range Status   Specimen source GC/Chlam THROAT  Final   Chlamydia Tr NOT DETECTED NOT DETECTED Final   N gonorrhoeae NOT DETECTED NOT DETECTED Final    Comment: (NOTE) This CT/NG assay has not been evaluated in patients with Orphia Mctigue history of  hysterectomy. Performed at [facility], [address] [place], [place] [address]      Labs: Basic Metabolic Panel: Recent Labs  Lab 06/24/21 1229 06/25/21 1204 06/26/21 0507  NA 135 135 139  K 3.3* 3.3* 3.1*  CL 102 103 102  CO2 25 26 30   GLUCOSE 106* 151* 113*  BUN 8 8 7   CREATININE 0.98 0.72 0.91  CALCIUM 9.1 9.0 8.9  MG  --   --  1.5*   Liver Function Tests: Recent Labs  Lab 06/24/21 1229 06/25/21 1204  AST 17 18  ALT 17 14  ALKPHOS 64 59  BILITOT 1.0 0.7  PROT 7.3 7.5  ALBUMIN 4.0 3.7   Recent Labs  Lab 06/24/21 1229  LIPASE 21   No results for input(s): AMMONIA in the last 168 hours. CBC: Recent Labs  Lab 06/24/21 1229  06/25/21 1204 06/26/21 0507  WBC 9.4 11.1* 9.3  NEUTROABS  --  8.8*  --   HGB 14.1 14.1 13.4  HCT 40.0 39.2 38.4*  MCV 92.0 89.9 91.6  PLT 299 296 295   Cardiac Enzymes: No results for input(s): CKTOTAL, CKMB, CKMBINDEX, TROPONINI in the last 168 hours. BNP: BNP (last 3 results) No results for input(s): BNP in the last 8760 hours.  ProBNP (last 3 results) No results for input(s): PROBNP in the last 8760 hours.  CBG: No results for input(s): GLUCAP in the last 168 hours.     Signed:  06/27/21 MD.  [facility] 06/26/2021, 5:36 PM

## 2021-06-27 ENCOUNTER — Emergency Department
Admission: EM | Admit: 2021-06-27 | Discharge: 2021-06-27 | Disposition: A | Discharge status: Home / Self Care | Payer: Self-pay | Attending: Emergency Medicine | Admitting: Emergency Medicine

## 2021-06-27 ENCOUNTER — Other Ambulatory Visit: Payer: Self-pay

## 2021-06-27 DIAGNOSIS — R070 Pain in throat: Secondary | ICD-10-CM | POA: Insufficient documentation

## 2021-06-27 DIAGNOSIS — R112 Nausea with vomiting, unspecified: Secondary | ICD-10-CM | POA: Insufficient documentation

## 2021-06-27 DIAGNOSIS — Z5321 Procedure and treatment not carried out due to patient leaving prior to being seen by health care provider: Secondary | ICD-10-CM | POA: Insufficient documentation

## 2021-06-27 LAB — CULTURE, BLOOD (ROUTINE X 2)
Special Requests: ADEQUATE
Special Requests: ADEQUATE

## 2021-06-27 MED ORDER — ONDANSETRON 4 MG PO TBDP
4.0000 mg | ORAL_TABLET | Freq: Once | ORAL | Status: AC
  Administered 2021-06-27: 4 mg via ORAL
  Filled 2021-06-27: qty 1

## 2021-06-27 MED ORDER — ACETAMINOPHEN 325 MG PO TABS
650.0000 mg | ORAL_TABLET | Freq: Once | ORAL | Status: AC
  Administered 2021-06-27: 650 mg via ORAL
  Filled 2021-06-27: qty 2

## 2021-06-27 NOTE — ED Triage Notes (Signed)
Pt presents via EMS c/o throat pain with N/V. Pt d/c from this facility on 06/26/21. Pt actively spitting but no vomiting noted.

## 2021-06-29 ENCOUNTER — Emergency Department
Admission: EM | Admit: 2021-06-29 | Discharge: 2021-06-29 | Disposition: A | Discharge status: Home / Self Care | Payer: Self-pay | Attending: Emergency Medicine | Admitting: Emergency Medicine

## 2021-06-29 ENCOUNTER — Other Ambulatory Visit: Payer: Self-pay

## 2021-06-29 ENCOUNTER — Encounter: Payer: Self-pay | Admitting: Emergency Medicine

## 2021-06-29 ENCOUNTER — Emergency Department: Payer: Self-pay

## 2021-06-29 DIAGNOSIS — K2900 Acute gastritis without bleeding: Secondary | ICD-10-CM | POA: Insufficient documentation

## 2021-06-29 DIAGNOSIS — F17290 Nicotine dependence, other tobacco product, uncomplicated: Secondary | ICD-10-CM | POA: Insufficient documentation

## 2021-06-29 LAB — COMPREHENSIVE METABOLIC PANEL
ALT: 17 U/L (ref 0–44)
AST: 20 U/L (ref 15–41)
Albumin: 3.8 g/dL (ref 3.5–5.0)
Alkaline Phosphatase: 55 U/L (ref 38–126)
Anion gap: 12 (ref 5–15)
BUN: 16 mg/dL (ref 6–20)
CO2: 30 mmol/L (ref 22–32)
Calcium: 10 mg/dL (ref 8.9–10.3)
Chloride: 98 mmol/L (ref 98–111)
Creatinine, Ser: 1.04 mg/dL (ref 0.61–1.24)
GFR, Estimated: >60 mL/min (ref >60)
Glucose, Bld: 107 mg/dL — ABNORMAL HIGH (ref 70–99)
Potassium: 3.2 mmol/L — ABNORMAL LOW (ref 3.5–5.1)
Sodium: 140 mmol/L (ref 135–145)
Total Bilirubin: 0.6 mg/dL (ref 0.3–1.2)
Total Protein: 9 g/dL — ABNORMAL HIGH (ref 6.5–8.1)

## 2021-06-29 LAB — URINALYSIS, COMPLETE (UACMP) WITH MICROSCOPIC
Bacteria, UA: NONE SEEN
Bilirubin Urine: NEGATIVE
Glucose, UA: NEGATIVE mg/dL
Ketones, ur: NEGATIVE mg/dL
Leukocytes,Ua: NEGATIVE
Nitrite: NEGATIVE
Protein, ur: 30 mg/dL — AB
Specific Gravity, Urine: 1.025 (ref 1.005–1.030)
Squamous Epithelial / HPF: NONE SEEN (ref 0–5)
pH: 5 (ref 5.0–8.0)

## 2021-06-29 LAB — CBC
HCT: 45.7 % (ref 39.0–52.0)
Hemoglobin: 16.1 g/dL (ref 13.0–17.0)
MCH: 32.3 pg (ref 26.0–34.0)
MCHC: 35.2 g/dL (ref 30.0–36.0)
MCV: 91.6 fL (ref 80.0–100.0)
Platelets: 322 10*3/uL (ref 150–400)
RBC: 4.99 MIL/uL (ref 4.22–5.81)
RDW: 11.9 % (ref 11.5–15.5)
WBC: 8.6 10*3/uL (ref 4.0–10.5)
nRBC: 0 % (ref 0.0–0.2)

## 2021-06-29 LAB — LIPASE, BLOOD: Lipase: 22 U/L (ref 11–51)

## 2021-06-29 MED ORDER — ONDANSETRON 4 MG PO TBDP: 8.0000 mg | ORAL_TABLET | Freq: Once | ORAL | Status: DC

## 2021-06-29 MED ORDER — ALUM & MAG HYDROXIDE-SIMETH 200-200-20 MG/5ML PO SUSP: 30.0000 mL | Freq: Once | ORAL | Status: DC

## 2021-06-29 MED ORDER — FAMOTIDINE 20 MG PO TABS: 20.0000 mg | ORAL_TABLET | Freq: Two times a day (BID) | ORAL | 0 refills | Status: AC

## 2021-06-29 MED ORDER — SUCRALFATE 1 G PO TABS: 1.0000 g | ORAL_TABLET | Freq: Four times a day (QID) | ORAL | 1 refills | Status: AC

## 2021-06-29 MED ORDER — METOCLOPRAMIDE HCL 10 MG PO TABS: 10.0000 mg | ORAL_TABLET | Freq: Four times a day (QID) | ORAL | 0 refills | Status: AC | PRN

## 2021-06-29 NOTE — ED Provider Notes (Addendum)
[facility] Emergency Department Provider Note  ____________________________________________  Time seen: Approximately 9:29 AM  I have reviewed the triage vital signs and the nursing notes.   HISTORY  Chief Complaint Vomiting    HPI Wayne Short is a 25 y.o. male with a past history of sepsis, alcoholic gastritis, cannabinoid hyperemesis who comes ED complaining of nausea and vomiting that started 3 days ago.  He went to [facility] and got an over-the-counter "nausea pill," but it did not relieve his symptoms.  He took a total of 4 and felt like the last 1 got stuck in his throat before he started vomiting.  Denies chest pain or shortness of breath.  No significant abdominal pain.  Feels dehydrated due to poor intake over the last few days.  No constipation or diarrhea.  No black or bloody stool.  No hematemesis.  Symptoms are waxing and waning, no aggravating or alleviating factors.  Denies recent alcohol, marijuana, or other drug use.  Reviewed EMR, see that he was seen in the emergency department in the past for gastritis.  He was prescribed omeprazole.  Reports he is currently taking no medications.  Past Medical History:  Diagnosis Date   Heart murmur      Patient Active Problem List   Diagnosis Date Noted   Fever 06/26/2021   Tonsillitis    Gram-positive bacteremia 06/25/2021   Hypokalemia 06/25/2021   Sepsis (HCC) 06/25/2021     Past Surgical History:  Procedure Laterality Date   CARDIAC SURGERY       Prior to Admission medications   Medication Sig Start Date End Date Taking? Authorizing Provider  famotidine (PEPCID) 20 MG tablet Take 1 tablet (20 mg total) by mouth 2 (two) times daily. 06/29/21  Yes Sharman Cheek, MD  metoCLOPramide (REGLAN) 10 MG tablet Take 1 tablet (10 mg total) by mouth every 6 (six) hours as needed. 06/29/21  Yes Sharman Cheek, MD  sucralfate (CARAFATE) 1 g tablet Take 1 tablet (1 g total) by mouth 4 (four) times  daily. 06/29/21  Yes Sharman Cheek, MD  amoxicillin-clavulanate (AUGMENTIN) 875-125 MG tablet Take 1 tablet by mouth 2 (two) times daily for 7 days. 06/26/21 07/03/21  Zigmund Daniel., MD  ondansetron (ZOFRAN ODT) 4 MG disintegrating tablet Take 1 tablet (4 mg total) by mouth every 8 (eight) hours as needed for nausea or vomiting. 06/24/21   Concha Se, MD  polyethylene glycol powder (MIRALAX) powder Take 3 capfuls for day 1 and 2. On day 3 start taking 1 capful 3 times a day. Slowly cut back as needed until you have normal bowel movements 02/10/18   Cardama, Amadeo Garnet, MD  sucralfate (CARAFATE) 1 g tablet Take 1 tablet (1 g total) by mouth 4 (four) times daily -  with meals and at bedtime for 14 days. 09/04/18 09/18/18  Aviva Kluver B, PA-C     Allergies Patient has no known allergies.   History reviewed. No pertinent family history.  Social History Social History   Tobacco Use   Smoking status: Every Day    Pack years: 0.00    Types: E-cigarettes   Smokeless tobacco: Never  Vaping Use   Vaping Use: Never used  Substance Use Topics   Alcohol use: Not Currently   Drug use: Yes    Types: Marijuana    Comment: saturday last used    Review of Systems  Constitutional:   No fever or chills.  ENT:   No sore throat. No  rhinorrhea. Cardiovascular:   No chest pain or syncope. Respiratory:   No dyspnea or cough. Gastrointestinal:   Negative for abdominal pain, positive vomiting Musculoskeletal:   Negative for focal pain or swelling All other systems reviewed and are negative except as documented above in ROS and HPI.  ____________________________________________   PHYSICAL EXAM:  VITAL SIGNS: ED Triage Vitals  Enc Vitals Group     BP 06/29/21 0520 114/75     Pulse Rate 06/29/21 0520 (!) 101     Resp 06/29/21 0520 20     Temp 06/29/21 0520 98.2 F (36.8 C)     Temp Source 06/29/21 0520 Oral     SpO2 06/29/21 0520 99 %     Weight 06/29/21 0510 190 lb (86.2 kg)      Height 06/29/21 0510 6\' 3"  (1.905 m)     Head Circumference --      Peak Flow --      Pain Score 06/29/21 0509 0     Pain Loc --      Pain Edu? --      Excl. in GC? --     Vital signs reviewed, nursing assessments reviewed.   Constitutional:   Alert and oriented. Non-toxic appearance. Eyes:   Conjunctivae are normal. EOMI. PERRL. ENT      Head:   Normocephalic and atraumatic.      Nose:   Normal.      Mouth/Throat:   Dry mucous membranes, normal oropharynx.      Neck:   No meningismus. Full ROM. Hematological/Lymphatic/Immunilogical:   No cervical lymphadenopathy. Cardiovascular:   Tachycardia heart rate 100. Symmetric bilateral radial and DP pulses.  No murmurs. Cap refill less than 2 seconds. Respiratory:   Normal respiratory effort without tachypnea/retractions. Breath sounds are clear and equal bilaterally. No wheezes/rales/rhonchi. Gastrointestinal:   Soft with mild left upper quadrant tenderness. Non distended. There is no CVA tenderness.  No rebound, rigidity, or guarding. Genitourinary:   deferred Musculoskeletal:   Normal range of motion in all extremities. No joint effusions.  No lower extremity tenderness.  No edema. Neurologic:   Normal speech and language.  Motor grossly intact. No acute focal neurologic deficits are appreciated.  Skin:    Skin is warm, dry and intact. No rash noted.  No petechiae, purpura, or bullae.  ____________________________________________    LABS (pertinent positives/negatives) (all labs ordered are listed, but only abnormal results are displayed) Labs Reviewed  COMPREHENSIVE METABOLIC PANEL - Abnormal; Notable for the following components:      Result Value   Potassium 3.2 (*)    Glucose, Bld 107 (*)    Total Protein 9.0 (*)    All other components within normal limits  URINALYSIS, COMPLETE (UACMP) WITH MICROSCOPIC - Abnormal; Notable for the following components:   Color, Urine AMBER (*)    APPearance CLOUDY (*)    Hgb urine  dipstick MODERATE (*)    Protein, ur 30 (*)    Non Squamous Epithelial PRESENT (*)    All other components within normal limits  LIPASE, BLOOD  CBC   ____________________________________________   EKG  Interpreted by me Sinus tachycardia rate 102.  Normal axis and intervals.  Normal QRS ST segments and T waves.  ____________________________________________    RADIOLOGY  DG Chest Portable 1 View  Result Date: 06/29/2021 CLINICAL DATA:  Epigastric abdominal pain. EXAM: PORTABLE CHEST 1 VIEW COMPARISON:  June 25, 2021. FINDINGS: The heart size and mediastinal contours are within normal limits. Both lungs are  clear. The visualized skeletal structures are unremarkable. IMPRESSION: No active disease. Electronically Signed   By: Lupita Raider M.D.   On: 06/29/2021 09:24    ____________________________________________   PROCEDURES Procedures  ____________________________________________  DIFFERENTIAL DIAGNOSIS   Gastritis, pancreatitis, dehydration, electrolyte abnormality, viral illness  CLINICAL IMPRESSION / ASSESSMENT AND PLAN / ED COURSE  Medications ordered in the ED: Medications  alum & mag hydroxide-simeth (MAALOX/MYLANTA) 200-200-20 MG/5ML suspension 30 mL (has no administration in time range)  ondansetron (ZOFRAN-ODT) disintegrating tablet 8 mg (has no administration in time range)    Pertinent labs & imaging results that were available during my care of the patient were reviewed by me and considered in my medical decision making (see chart for details).  Wayne Short was evaluated in Emergency Department on 06/29/2021 for the symptoms described in the history of present illness. He was evaluated in the context of the global COVID-19 pandemic, which necessitated consideration that the patient might be at risk for infection with the SARS-CoV-2 virus that causes COVID-19. Institutional protocols and algorithms that pertain to the evaluation of patients at risk for  COVID-19 are in a state of rapid change based on information released by regulatory bodies including the CDC and federal and state organizations. These policies and algorithms were followed during the patient's care in the ED.   Patient presents with nausea vomiting, has some left upper quadrant tenderness.  Noncompliant with acid suppression therapy that is been prescribed in the past.  Appears somewhat dehydrated but requests water and ginger ale.  Labs and exam are reassuring.  Will give oral antacids and antiemetics.  If tolerating p.o. he will be stable for discharge home with prescriptions for antacid regimen.   Considering the patient's symptoms, medical history, and physical examination today, I have low suspicion for cholecystitis or biliary pathology, pancreatitis, perforation or bowel obstruction, hernia, intra-abdominal abscess, AAA or dissection, volvulus or intussusception, mesenteric ischemia, or appendicitis.  Clinical Course as of 06/29/21 1024  Sun Jun 29, 2021  1022 Tolerating p.o. and feeling better. [PS]    Clinical Course User Index [PS] Sharman Cheek, MD     ____________________________________________   FINAL CLINICAL IMPRESSION(S) / ED DIAGNOSES    Final diagnoses:  Acute gastritis without hemorrhage, unspecified gastritis type     ED Discharge Orders          Ordered    sucralfate (CARAFATE) 1 g tablet  4 times daily        06/29/21 0929    famotidine (PEPCID) 20 MG tablet  2 times daily        06/29/21 0929    metoCLOPramide (REGLAN) 10 MG tablet  Every 6 hours PRN        06/29/21 0929            Portions of this note were generated with dragon dictation software. Dictation errors may occur despite best attempts at proofreading.   Sharman Cheek, MD 06/29/21 4801    Sharman Cheek, MD 06/29/21 1024

## 2021-06-29 NOTE — ED Notes (Signed)
Wayne Short, ED secretary went to check on pt and informed Dr Scotty Court that the pt wanted an update- Ronnie relayed a message to pt from Dr Scotty Court and pt told him that he was leaving- pt noted to be walking out in NAD

## 2021-06-29 NOTE — ED Triage Notes (Signed)
Pt arrived via POV with reports of taking 4 nausea pills from [employer] since 4pm, pt does not know the name of the medication. pt states he felt like it got stuck in his throat and then started vomiting.    Pt spitting in lobby at this time.   Pt states he was recently admitted to the hospital because of his stomach and vomiting.   Denies any pain at this time.

## 2021-06-30 ENCOUNTER — Emergency Department
Admission: EM | Admit: 2021-06-30 | Discharge: 2021-06-30 | Disposition: A | Discharge status: Home / Self Care | Payer: Self-pay | Attending: Emergency Medicine | Admitting: Emergency Medicine

## 2021-06-30 ENCOUNTER — Other Ambulatory Visit: Payer: Self-pay

## 2021-06-30 ENCOUNTER — Encounter: Payer: Self-pay | Admitting: Emergency Medicine

## 2021-06-30 ENCOUNTER — Emergency Department: Payer: Self-pay

## 2021-06-30 DIAGNOSIS — Z20822 Contact with and (suspected) exposure to covid-19: Secondary | ICD-10-CM | POA: Insufficient documentation

## 2021-06-30 DIAGNOSIS — K5900 Constipation, unspecified: Secondary | ICD-10-CM | POA: Insufficient documentation

## 2021-06-30 DIAGNOSIS — F17210 Nicotine dependence, cigarettes, uncomplicated: Secondary | ICD-10-CM | POA: Insufficient documentation

## 2021-06-30 DIAGNOSIS — R1013 Epigastric pain: Secondary | ICD-10-CM | POA: Insufficient documentation

## 2021-06-30 LAB — COMPREHENSIVE METABOLIC PANEL
ALT: 62 U/L — ABNORMAL HIGH (ref 0–44)
AST: 78 U/L — ABNORMAL HIGH (ref 15–41)
Albumin: 3.8 g/dL (ref 3.5–5.0)
Alkaline Phosphatase: 63 U/L (ref 38–126)
Anion gap: 10 (ref 5–15)
BUN: 16 mg/dL (ref 6–20)
CO2: 29 mmol/L (ref 22–32)
Calcium: 9.6 mg/dL (ref 8.9–10.3)
Chloride: 97 mmol/L — ABNORMAL LOW (ref 98–111)
Creatinine, Ser: 0.95 mg/dL (ref 0.61–1.24)
GFR, Estimated: >60 mL/min (ref >60)
Glucose, Bld: 103 mg/dL — ABNORMAL HIGH (ref 70–99)
Potassium: 3.1 mmol/L — ABNORMAL LOW (ref 3.5–5.1)
Sodium: 136 mmol/L (ref 135–145)
Total Bilirubin: 1 mg/dL (ref 0.3–1.2)
Total Protein: 8.8 g/dL — ABNORMAL HIGH (ref 6.5–8.1)

## 2021-06-30 LAB — CBC
HCT: 44.9 % (ref 39.0–52.0)
Hemoglobin: 15.9 g/dL (ref 13.0–17.0)
MCH: 31.5 pg (ref 26.0–34.0)
MCHC: 35.4 g/dL (ref 30.0–36.0)
MCV: 89.1 fL (ref 80.0–100.0)
Platelets: 371 10*3/uL (ref 150–400)
RBC: 5.04 MIL/uL (ref 4.22–5.81)
RDW: 11.9 % (ref 11.5–15.5)
WBC: 7.9 10*3/uL (ref 4.0–10.5)
nRBC: 0 % (ref 0.0–0.2)

## 2021-06-30 LAB — CULTURE, BLOOD (ROUTINE X 2)
Culture: NO GROWTH
Culture: NO GROWTH
Special Requests: ADEQUATE
Special Requests: ADEQUATE

## 2021-06-30 LAB — LIPASE, BLOOD: Lipase: 19 U/L (ref 11–51)

## 2021-06-30 LAB — RESP PANEL BY RT-PCR (FLU A&B, COVID) ARPGX2
Influenza A by PCR: NEGATIVE
Influenza B by PCR: NEGATIVE
SARS Coronavirus 2 by RT PCR: NEGATIVE

## 2021-06-30 MED ORDER — ALUM & MAG HYDROXIDE-SIMETH 200-200-20 MG/5ML PO SUSP
30.0000 mL | Freq: Once | ORAL | Status: AC
  Administered 2021-06-30: 30 mL via ORAL
  Filled 2021-06-30: qty 30

## 2021-06-30 MED ORDER — LIDOCAINE VISCOUS HCL 2 % MT SOLN
15.0000 mL | Freq: Once | OROMUCOSAL | Status: AC
  Administered 2021-06-30: 15 mL via ORAL
  Filled 2021-06-30: qty 15

## 2021-06-30 MED ORDER — FAMOTIDINE 20 MG PO TABS
20.0000 mg | ORAL_TABLET | Freq: Once | ORAL | Status: AC
  Administered 2021-06-30: 20 mg via ORAL
  Filled 2021-06-30: qty 1

## 2021-06-30 NOTE — ED Notes (Signed)
Pt provided warm blanket as requested 

## 2021-06-30 NOTE — ED Notes (Signed)
Patient transported to X-ray 

## 2021-06-30 NOTE — Discharge Instructions (Addendum)
Follow up with GI if symptoms are not improving with medication.  If symptoms worsen and you are unable to see primary care or the GI specialist, return to the ER.

## 2021-06-30 NOTE — ED Triage Notes (Signed)
Arrived EMS. Pt here for mid upper abdominal pain that he describes as a "gas bubble".  Reports no bowel movement for 1 week and has had vomiting. Was here yesterday for same.  VSS.  NAD. Unlabored.  No fever.

## 2021-06-30 NOTE — ED Provider Notes (Signed)
[facility] Emergency Department Provider Note ____________________________________________   Event Date/Time   First MD Initiated Contact with Patient 06/30/21 1125     (approximate)  I have reviewed the triage vital signs and the nursing notes.   HISTORY  Chief Complaint Abdominal Pain  HPI Wayne Short is a 25 y.o. male with no chronic medical history  presents to the emergency department for treatment and evaluation of epigastric pain and constipation. Last bowel movement was 1 week ago. He denies fever, nausea or vomiting. No alleviating measures prior to arrival.         Past Medical History:  Diagnosis Date   Heart murmur     Patient Active Problem List   Diagnosis Date Noted   Fever 06/26/2021   Tonsillitis    Gram-positive bacteremia 06/25/2021   Hypokalemia 06/25/2021   Sepsis (HCC) 06/25/2021    Past Surgical History:  Procedure Laterality Date   CARDIAC SURGERY      Prior to Admission medications   Medication Sig Start Date End Date Taking? Authorizing Provider  amoxicillin-clavulanate (AUGMENTIN) 875-125 MG tablet Take 1 tablet by mouth 2 (two) times daily for 7 days. 06/26/21 07/03/21  Zigmund Daniel., MD  famotidine (PEPCID) 20 MG tablet Take 1 tablet (20 mg total) by mouth 2 (two) times daily. 06/29/21   Sharman Cheek, MD  metoCLOPramide (REGLAN) 10 MG tablet Take 1 tablet (10 mg total) by mouth every 6 (six) hours as needed. 06/29/21   Sharman Cheek, MD  ondansetron (ZOFRAN ODT) 4 MG disintegrating tablet Take 1 tablet (4 mg total) by mouth every 8 (eight) hours as needed for nausea or vomiting. 06/24/21   Concha Se, MD  polyethylene glycol powder (MIRALAX) powder Take 3 capfuls for day 1 and 2. On day 3 start taking 1 capful 3 times a day. Slowly cut back as needed until you have normal bowel movements 02/10/18   Cardama, Amadeo Garnet, MD  sucralfate (CARAFATE) 1 g tablet Take 1 tablet (1 g total) by mouth 4  (four) times daily -  with meals and at bedtime for 14 days. 09/04/18 09/18/18  Aviva Kluver B, PA-C  sucralfate (CARAFATE) 1 g tablet Take 1 tablet (1 g total) by mouth 4 (four) times daily. 06/29/21   Sharman Cheek, MD    Allergies Patient has no known allergies.  History reviewed. No pertinent family history.  Social History Social History   Tobacco Use   Smoking status: Every Day    Pack years: 0.00    Types: E-cigarettes   Smokeless tobacco: Never  Vaping Use   Vaping Use: Never used  Substance Use Topics   Alcohol use: Not Currently   Drug use: Yes    Types: Marijuana    Comment: saturday last used    Review of Systems  Constitutional: No fever/chills Eyes: No visual changes. ENT: No sore throat. Cardiovascular: Denies chest pain. Respiratory: Denies shortness of breath. Gastrointestinal: Positive for abdominal pain.  No nausea, no vomiting.  No diarrhea.  Positive for constipation. Genitourinary: Negative for dysuria. Musculoskeletal: Negative for back pain. Skin: Negative for rash. Neurological: Negative for headaches, focal weakness or numbness. ____________________________________________   PHYSICAL EXAM:  VITAL SIGNS: ED Triage Vitals  Enc Vitals Group     BP 06/30/21 1056 117/78     Pulse Rate 06/30/21 1054 (!) 104     Resp 06/30/21 1054 18     Temp 06/30/21 1054 98.8 F (37.1 C)  Temp Source 06/30/21 1054 Oral     SpO2 06/30/21 1054 96 %     Weight 06/30/21 1057 184 lb (83.5 kg)     Height 06/30/21 1057 6\' 3"  (1.905 m)     Head Circumference --      Peak Flow --      Pain Score 06/30/21 1057 9     Pain Loc --      Pain Edu? --      Excl. in GC? --     Constitutional: Alert and oriented. Well appearing and in no acute distress. Eyes: Conjunctivae are normal. Head: Atraumatic. Nose: No congestion/rhinnorhea. Mouth/Throat: Mucous membranes are moist.  Oropharynx non-erythematous. Neck: No stridor.    Hematological/Lymphatic/Immunilogical: No cervical lymphadenopathy. Cardiovascular: Normal rate, regular rhythm. Grossly normal heart sounds.  Good peripheral circulation. Respiratory: Normal respiratory effort.  No retractions. Lungs CTAB. Gastrointestinal: Soft, epigastric tenderness. No distention. No abdominal bruits. No CVA tenderness. Genitourinary:  Musculoskeletal: No lower extremity tenderness nor edema.  No joint effusions. Neurologic:  Normal speech and language. No gross focal neurologic deficits are appreciated. No gait instability. Skin:  Skin is warm, dry and intact. No rash noted. Psychiatric: Mood and affect are normal. Speech and behavior are normal.  ____________________________________________   LABS (all labs ordered are listed, but only abnormal results are displayed)  Labs Reviewed  COMPREHENSIVE METABOLIC PANEL - Abnormal; Notable for the following components:      Result Value   Potassium 3.1 (*)    Chloride 97 (*)    Glucose, Bld 103 (*)    Total Protein 8.8 (*)    AST 78 (*)    ALT 62 (*)    All other components within normal limits  RESP PANEL BY RT-PCR (FLU A&B, COVID) ARPGX2  LIPASE, BLOOD  CBC  URINALYSIS, COMPLETE (UACMP) WITH MICROSCOPIC   ____________________________________________  EKG  ED ECG REPORT I, Oaklen Thiam, FNP-BC personally viewed and interpreted this ECG.   Date: 06/30/2021  EKG Time: 1059  Rate: 110  Rhythm: sinus tachycardia  Axis: Rightward  Intervals:none  ST&T Change: no ST elevation  ____________________________________________  RADIOLOGY  ED MD interpretation:    No acute concerns on image of abdomen.  I, 08/31/2021, personally viewed and evaluated these images (plain radiographs) as part of my medical decision making, as well as reviewing the written report by the radiologist.  Official radiology report(s): DG Abdomen 1 View  Result Date: 06/30/2021 CLINICAL DATA:  Epigastric pain, constipation.  EXAM: ABDOMEN - 1 VIEW COMPARISON:  None. FINDINGS: No dilated loops of large or small bowel. Gas and stool in the rectum. No pathologic calcifications. No organomegaly. No acute osseous abnormality IMPRESSION: Normal abdominal radiograph. Electronically Signed   By: 08/31/2021 M.D.   On: 06/30/2021 12:01    ____________________________________________   PROCEDURES  Procedure(s) performed (including Critical Care):  Procedures  ____________________________________________   INITIAL IMPRESSION / ASSESSMENT AND PLAN     25 year old male presents to the emergency department for treatment and evaluation of epigastric pain, constipation, and feeling like "gas is trapped."  He was here yesterday for nausea and vomiting x 3 days and denied abdominal pain.  DIFFERENTIAL DIAGNOSIS  Constipation, SBO, ileus, cholelithiasis  ED COURSE  Abdominal imaging without evidence of constipation or bowel  obstruction. 30 of ruq to be completed.     ___________________________________________   FINAL CLINICAL IMPRESSION(S) / ED DIAGNOSES  Final diagnoses:  Epigastric abdominal pain  Epigastric pain     ED  Discharge Orders     None        Cristina Mattern was evaluated in Emergency Department on 06/30/2021 for the symptoms described in the history of present illness. He was evaluated in the context of the global COVID-19 pandemic, which necessitated consideration that the patient might be at risk for infection with the SARS-CoV-2 virus that causes COVID-19. Institutional protocols and algorithms that pertain to the evaluation of patients at risk for COVID-19 are in a state of rapid change based on information released by regulatory bodies including the CDC and federal and state organizations. These policies and algorithms were followed during the patient's care in the ED.   Note:  This document was prepared using Dragon voice recognition software and may include unintentional dictation  errors.    Chinita Pester, FNP 06/30/21 1350    Concha Se, MD 07/01/21 [phone]

## 2021-06-30 NOTE — ED Notes (Signed)
Pt to ER via EMS from home with c/o abdominal pain x 1 week.  Pt has been seen for same and is not doing any better.  Pt was prescribed antibiotics which he has not taken due to nausea.  VSS per EMS.

## 2021-07-07 ENCOUNTER — Telehealth: Payer: Self-pay | Admitting: Gastroenterology

## 2021-07-07 NOTE — Telephone Encounter (Signed)
Tried to call patient to reschedule his appointment per Dr Tobi Bastos.  No answer and VM was full.

## 2021-07-08 ENCOUNTER — Ambulatory Visit: Payer: Self-pay | Admitting: Gastroenterology

## 2022-05-08 DIAGNOSIS — Z77098 Contact with and (suspected) exposure to other hazardous, chiefly nonmedicinal, chemicals: Secondary | ICD-10-CM | POA: Diagnosis not present

## 2022-05-08 DIAGNOSIS — R079 Chest pain, unspecified: Secondary | ICD-10-CM | POA: Diagnosis not present

## 2022-05-08 DIAGNOSIS — F17210 Nicotine dependence, cigarettes, uncomplicated: Secondary | ICD-10-CM | POA: Diagnosis not present

## 2022-05-08 DIAGNOSIS — R0789 Other chest pain: Secondary | ICD-10-CM | POA: Diagnosis not present

## 2022-05-12 DIAGNOSIS — Z77098 Contact with and (suspected) exposure to other hazardous, chiefly nonmedicinal, chemicals: Secondary | ICD-10-CM | POA: Diagnosis not present

## 2022-12-14 DIAGNOSIS — Z00110 Health examination for newborn under 8 days old: Secondary | ICD-10-CM | POA: Diagnosis not present

## 2022-12-14 DIAGNOSIS — Q828 Other specified congenital malformations of skin: Secondary | ICD-10-CM | POA: Diagnosis not present

## 2022-12-14 DIAGNOSIS — S0993XA Unspecified injury of face, initial encounter: Secondary | ICD-10-CM | POA: Diagnosis not present

## 2022-12-14 IMAGING — CT CT NECK W/ CM
4 series · 14 of 33 positions shown, 17 images · IV contrast (omnipaque)
Comparison: None.

CLINICAL DATA: Sore throat, positive blood cultures

EXAM:
CT NECK WITH CONTRAST
TECHNIQUE: Multidetector CT imaging of the neck was performed using the
standard protocol following the bolus administration of intravenous
contrast.
CONTRAST:  75mL OMNIPAQUE IOHEXOL 300 MG/ML  SOLN

[Series 2: axial neck · axial · 0.59mm/px · z∈[+1322,+1490]mm · 5 of 126 slices shown, 7 images]
[im 21/126  soft-tissue]
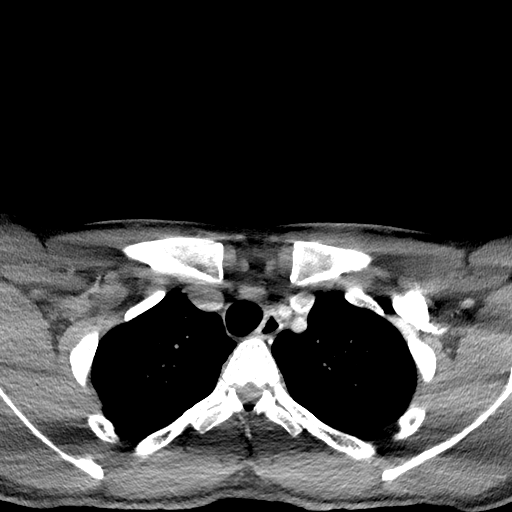
[im 21/126  bone]
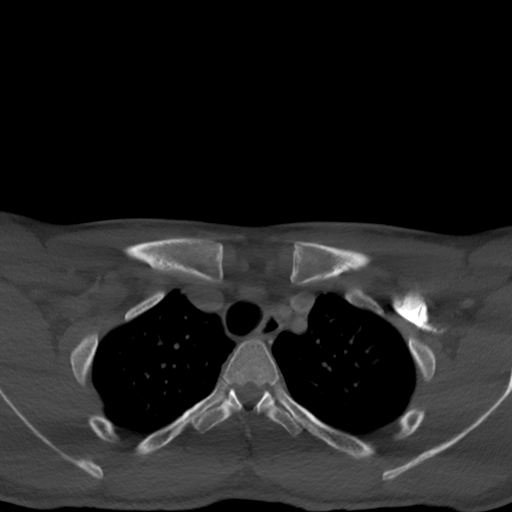
[im 42/126  bone]
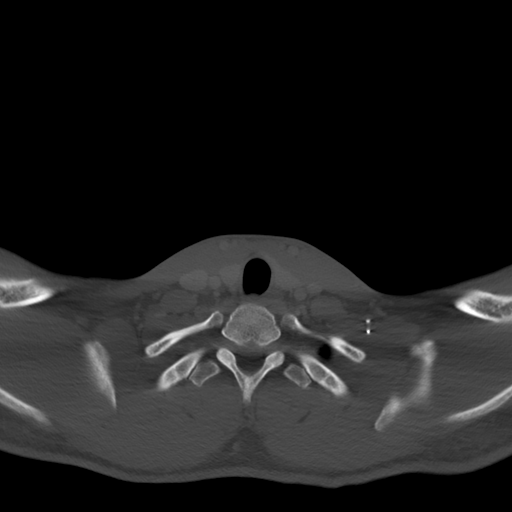
[im 63/126  bone]
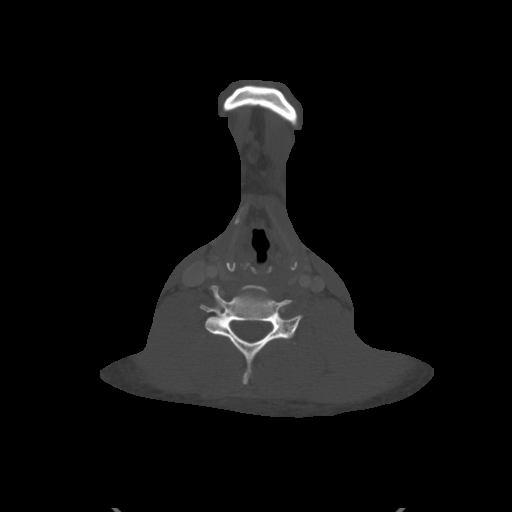
[im 84/126  bone]
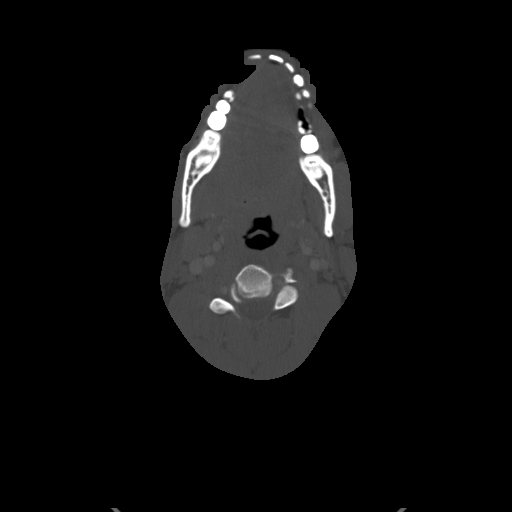
[im 105/126  soft-tissue]
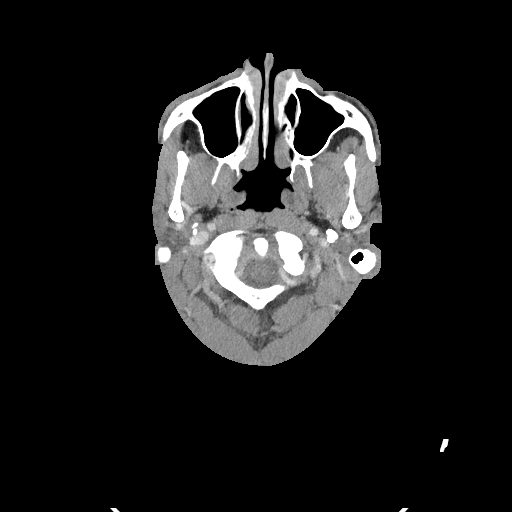
[im 105/126  bone]
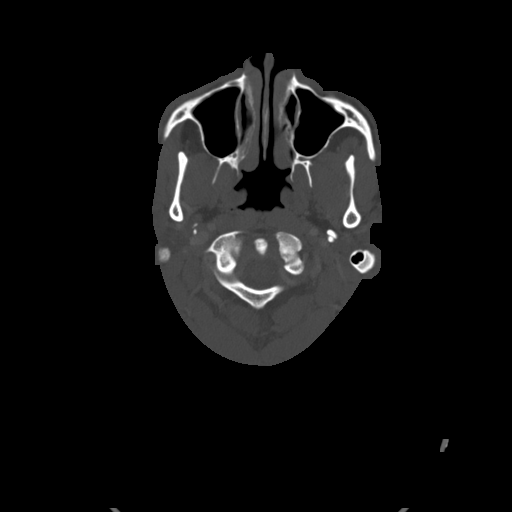

[Series 5: sag neck · sagittal · 0.51mm/px · 5 of 171 slices shown, 6 images]
[im 57/171  bone]
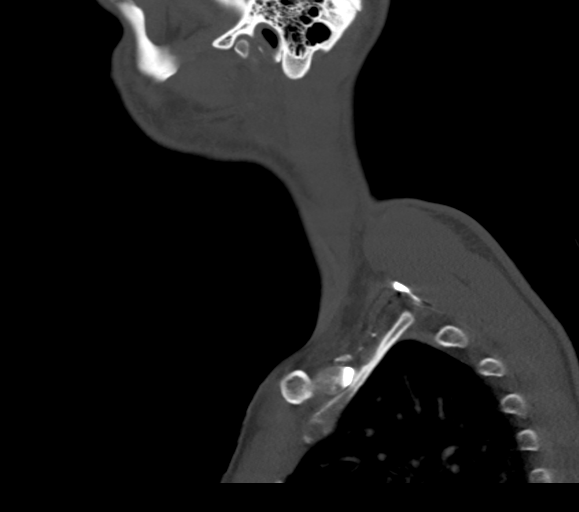
[im 71/171  bone]
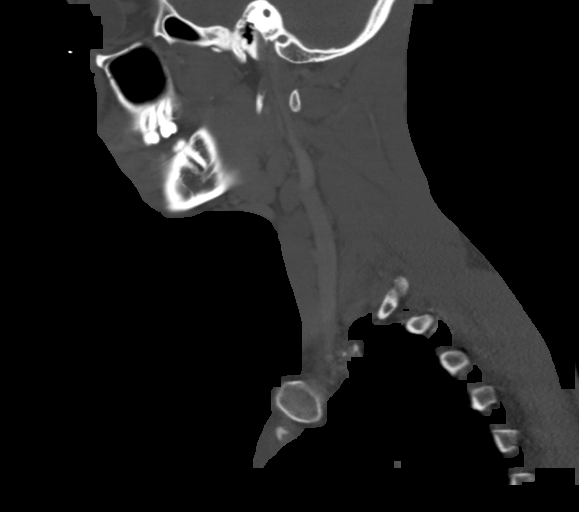
[im 86/171  soft-tissue]
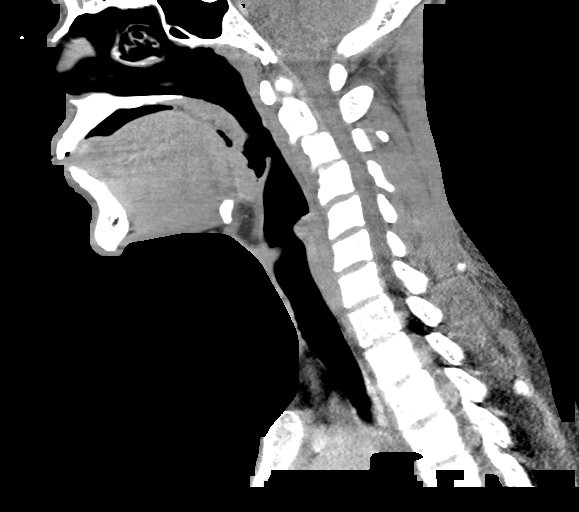
[im 86/171  bone]
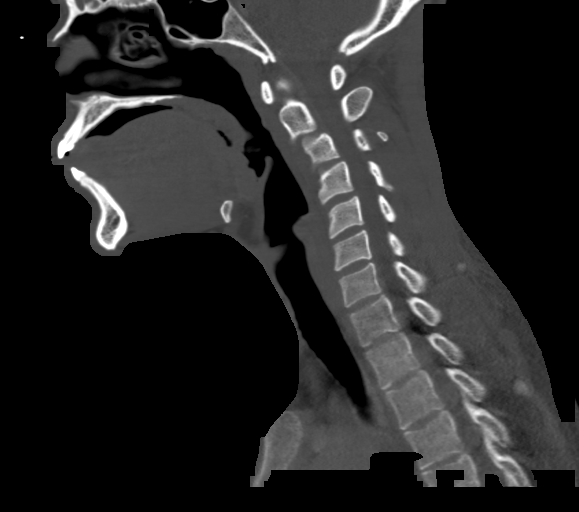
[im 100/171  bone]
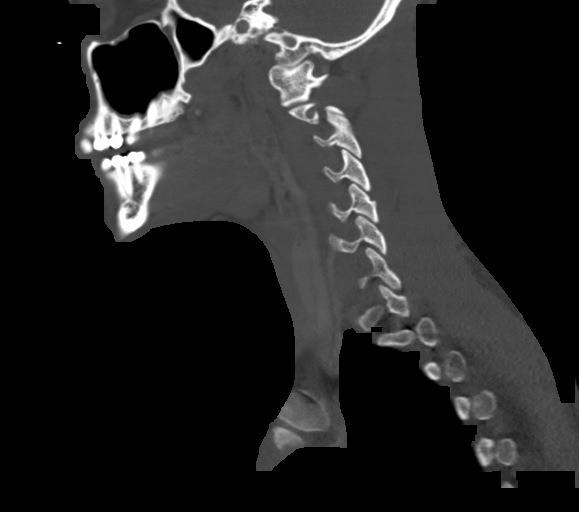
[im 114/171  bone]
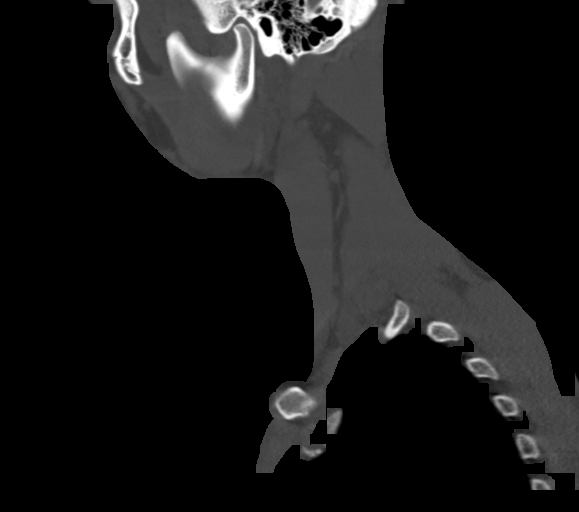

[Series 6: cor neck · coronal · 0.51mm/px · 3 of 155 slices shown]
[im 31/155  bone]
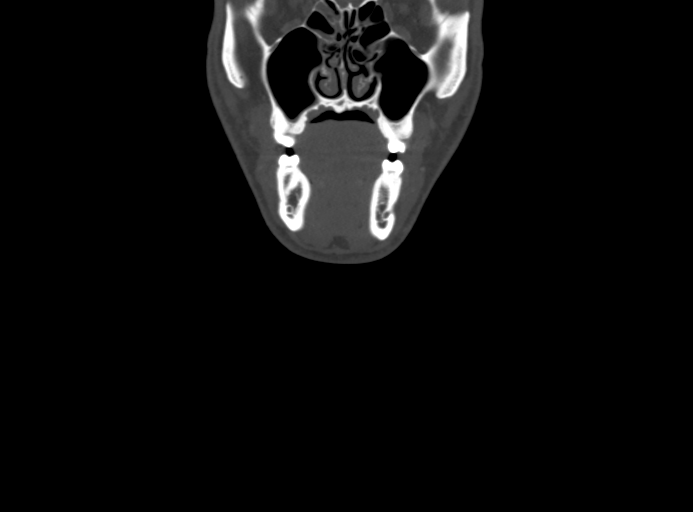
[im 62/155  bone]
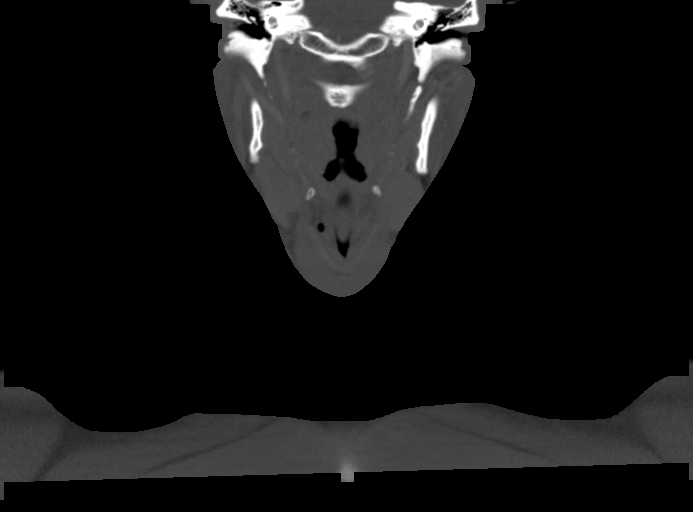
[im 93/155  bone]
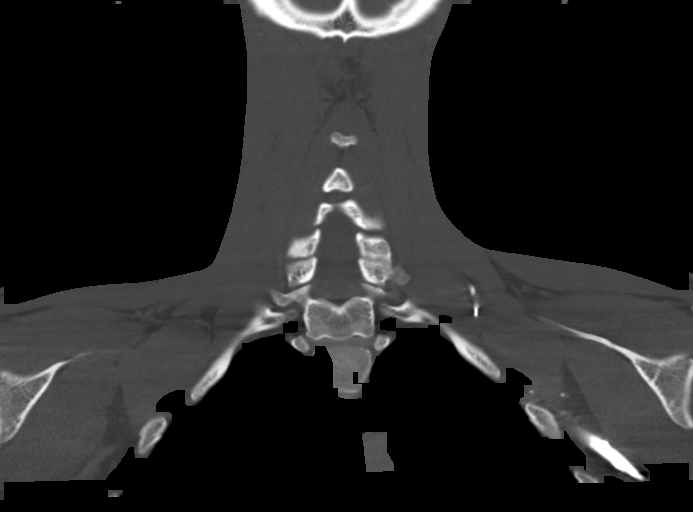

[Series 7: orthogonal ax · axial · 0.60mm/px · 1 of 131 slices shown]
[im 22/131  bone]
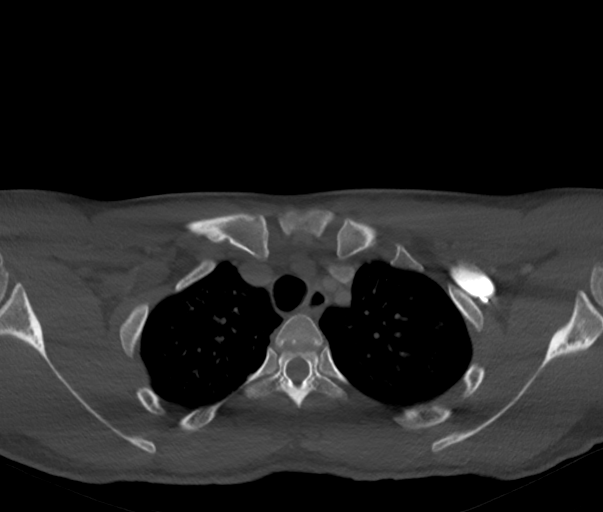

[14 of 33 positions shown; findings below may reference images not displayed]

FINDINGS: Pharynx and larynx: Prominence of the palatine tonsils, right
greater than left, with heterogeneity but no peritonsillar abscess.
Pharynx is otherwise unremarkable. The airway is patent.

Salivary glands: Parotid and submandibular glands are unremarkable.

Thyroid: Head

Lymph nodes: Asymmetric enlarged 2.2 cm right level 2 node is likely
reactive.

Vascular: Major neck vessels are patent.

Limited intracranial: No abnormal enhancement.

Visualized orbits: Unremarkable.

Mastoids and visualized paranasal sinuses: Patchy mucosal
thickening. Mastoid air cells are clear.

Skeleton: Congenital or less likely post traumatic defect of the
left C4 articular pillar with remodeling of the superior left C5
facet. No acute abnormality.

Upper chest: Included upper lungs are clear.

Other: None.
IMPRESSION: Palatine tonsillitis without abscess. Reactive right level 2
adenopathy.

## 2022-12-18 IMAGING — DX DG CHEST 1V PORT
1 series · 1 of 1 positions shown · non-contrast
Comparison: June 25, 2021.

CLINICAL DATA: Epigastric abdominal pain.

EXAM:
PORTABLE CHEST 1 VIEW

[chest ap]
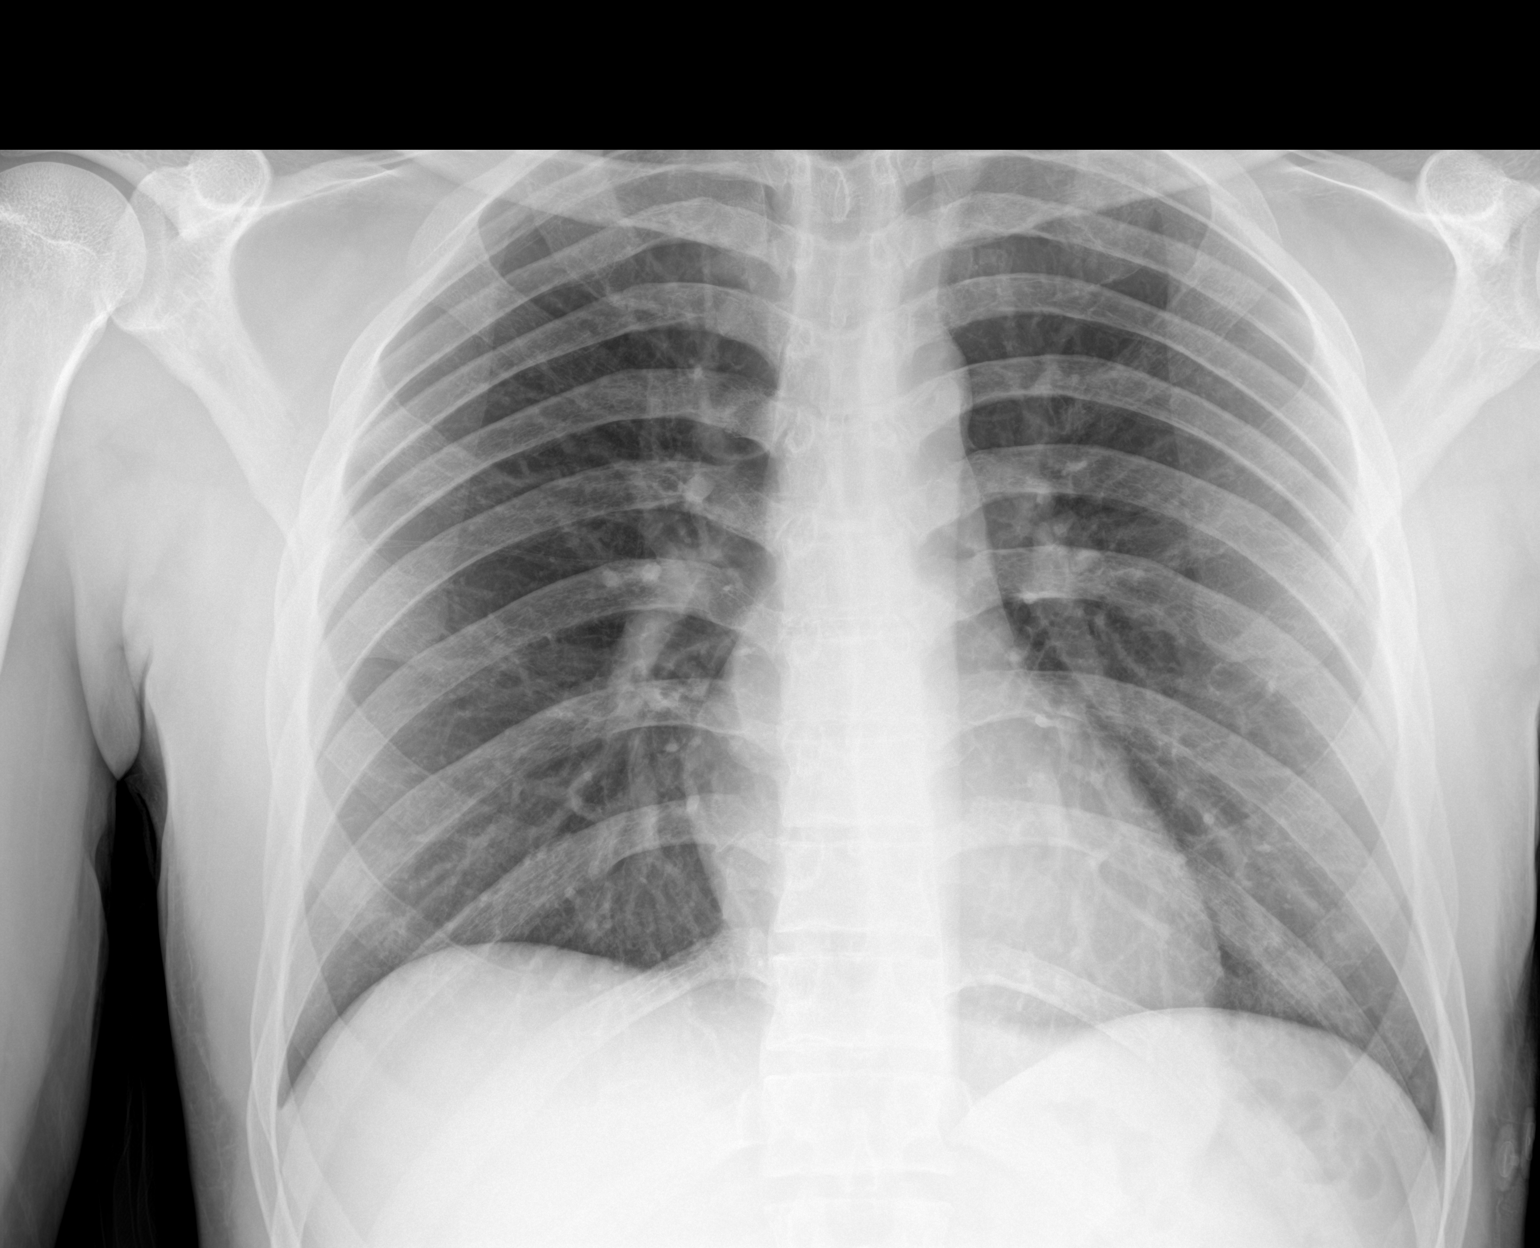

[1 of 1 positions shown; findings below may reference images not displayed]

FINDINGS: The heart size and mediastinal contours are within normal limits.
Both lungs are clear. The visualized skeletal structures are
unremarkable.
IMPRESSION: No active disease.

## 2022-12-19 IMAGING — CR DG ABDOMEN 1V
1 series · 2 of 2 positions shown · non-contrast
Comparison: None.

CLINICAL DATA: Epigastric pain, constipation.

EXAM:
ABDOMEN - 1 VIEW

[Series 1: dg abd 1 view · 0.14mm/px · 2 of 2 slices shown]
[im 1/2]
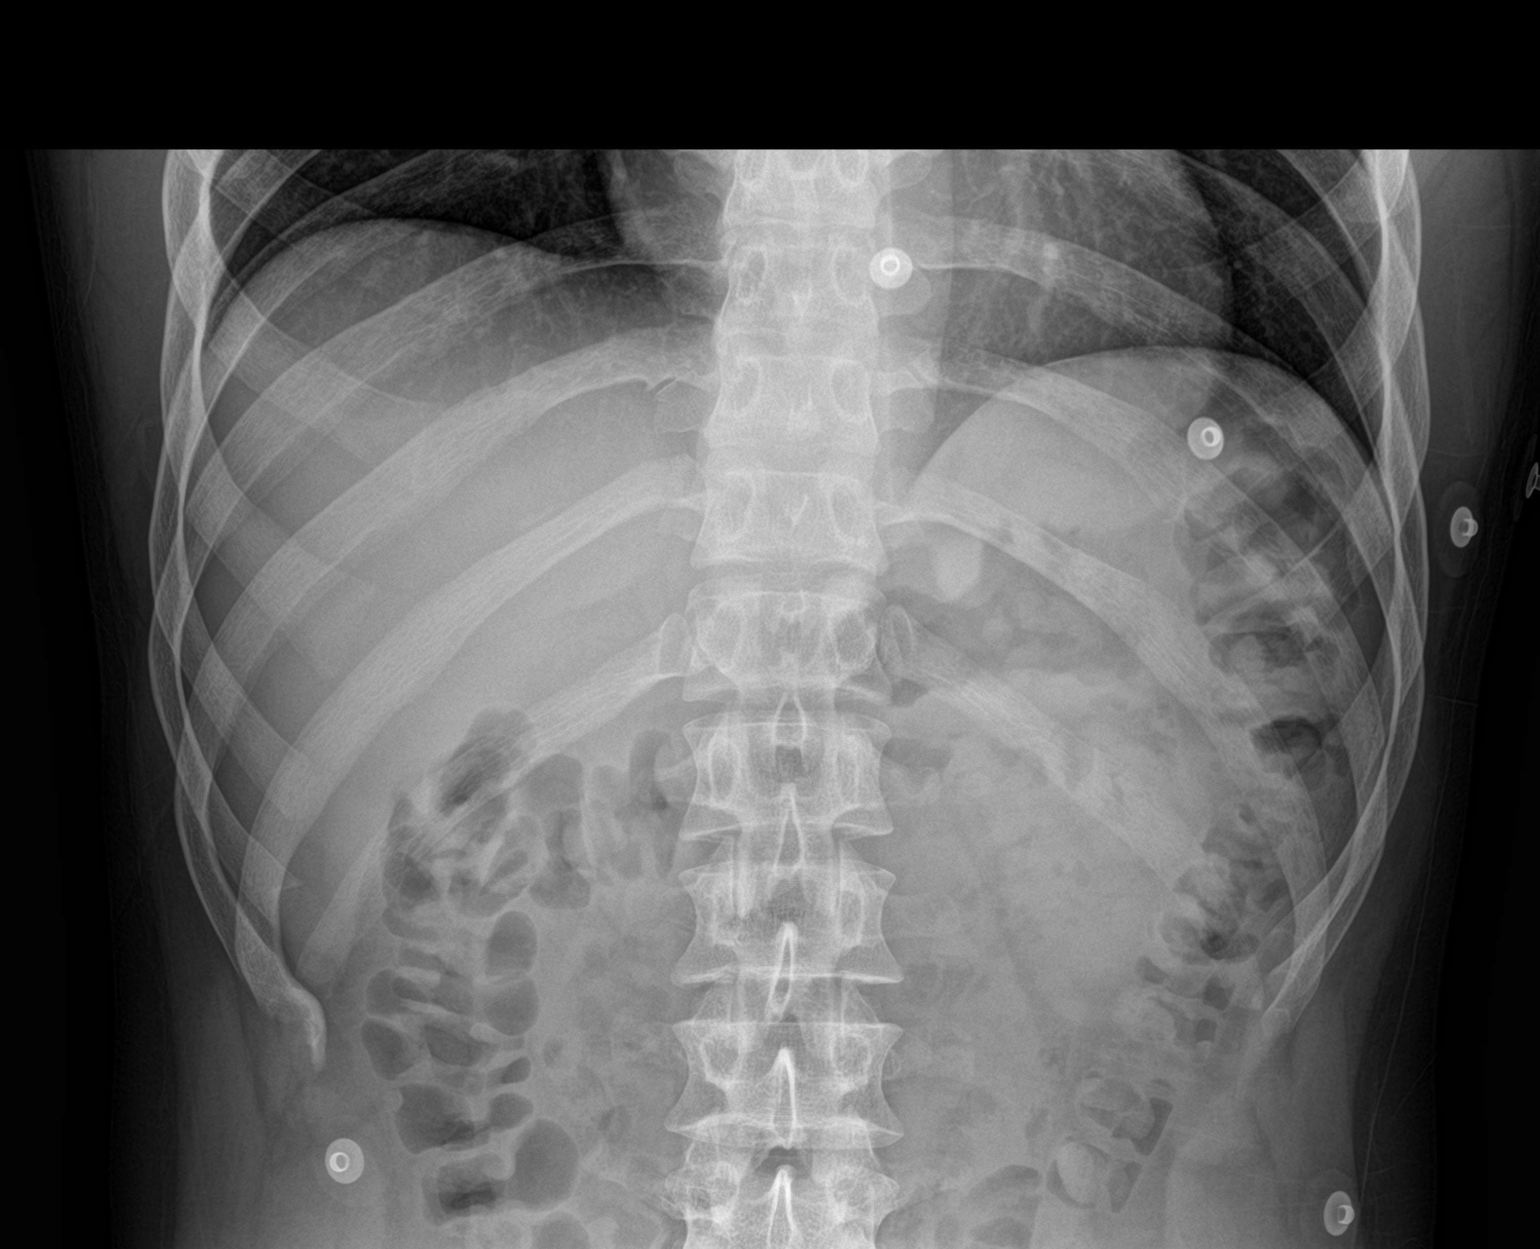
[im 2/2]
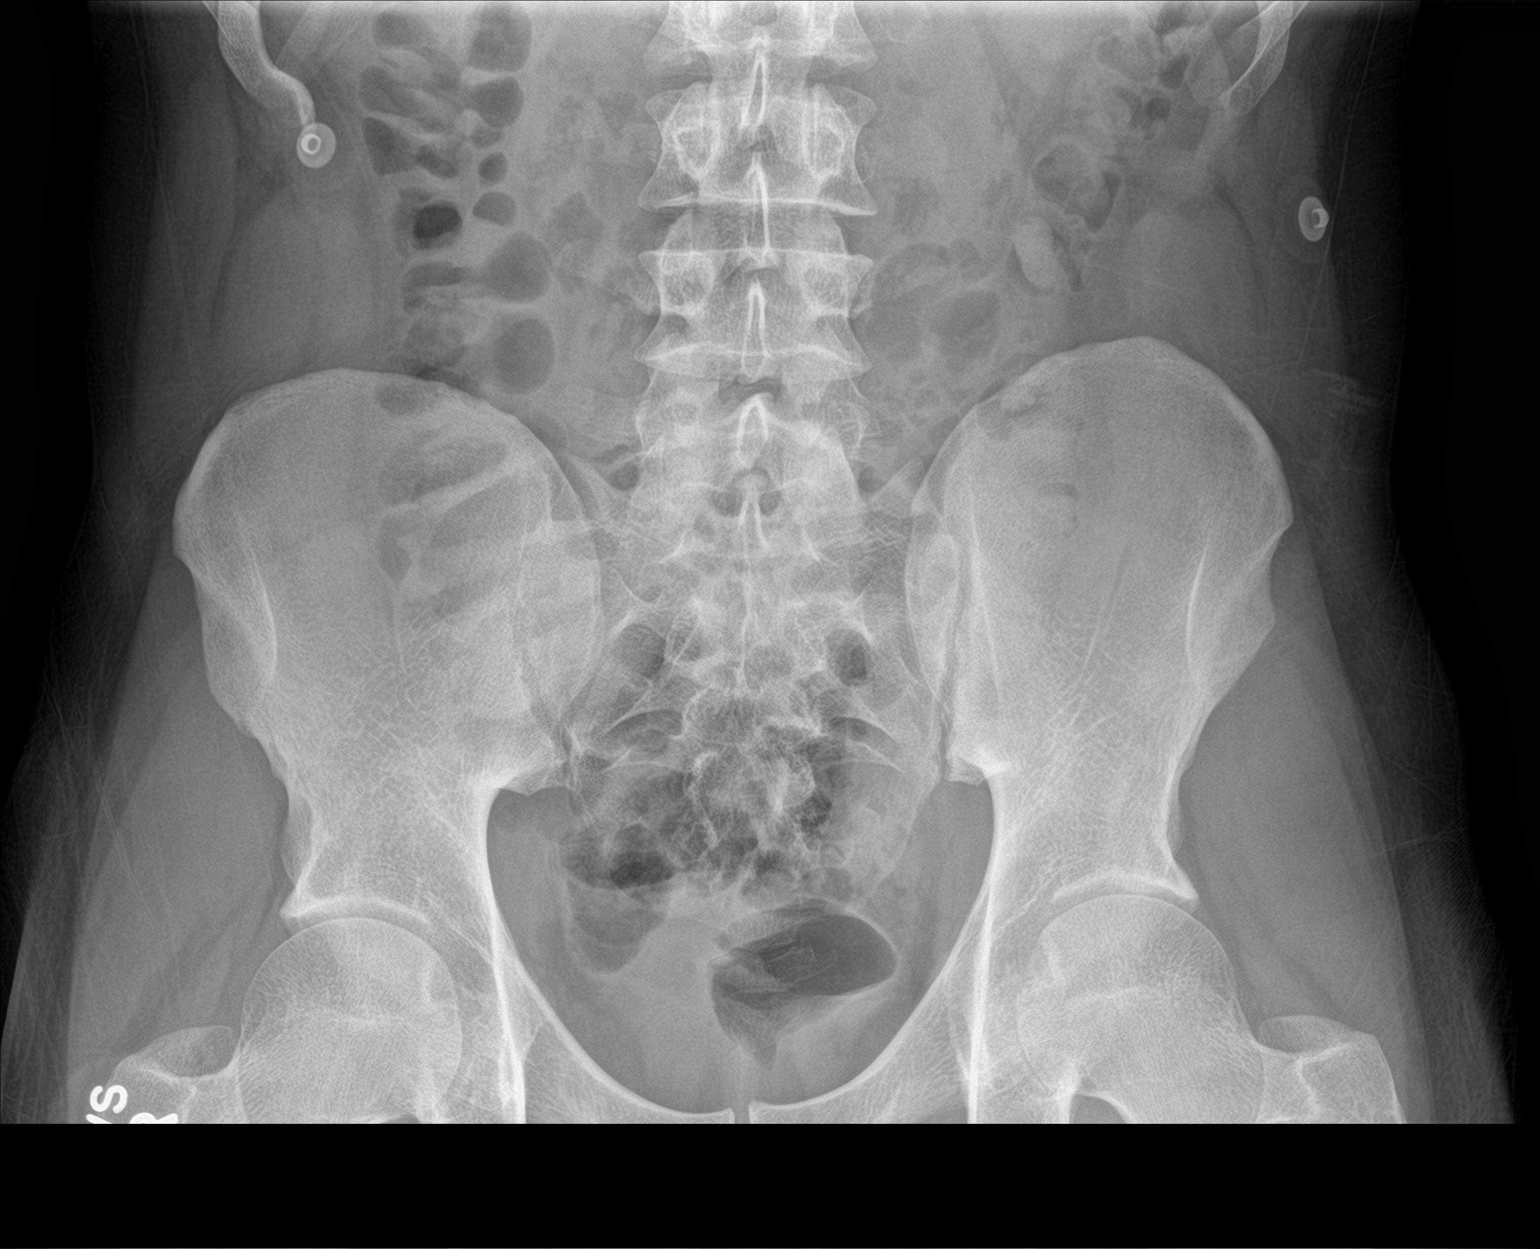

[2 of 2 positions shown; findings below may reference images not displayed]

FINDINGS: No dilated loops of large or small bowel. Gas and stool in the
rectum. No pathologic calcifications. No organomegaly. No acute
osseous abnormality
IMPRESSION: Normal abdominal radiograph.

## 2023-07-04 ENCOUNTER — Ambulatory Visit (HOSPITAL_COMMUNITY)
Admission: EM | Admit: 2023-07-04 | Discharge: 2023-07-04 | Disposition: A | Discharge status: Home / Self Care | Payer: 59 | Attending: Internal Medicine | Admitting: Internal Medicine

## 2023-07-04 ENCOUNTER — Encounter (HOSPITAL_COMMUNITY): Payer: Self-pay | Admitting: Emergency Medicine

## 2023-07-04 DIAGNOSIS — T2691XA Corrosion of right eye and adnexa, part unspecified, initial encounter: Secondary | ICD-10-CM

## 2023-07-04 DIAGNOSIS — L03213 Periorbital cellulitis: Secondary | ICD-10-CM

## 2023-07-04 DIAGNOSIS — H10021 Other mucopurulent conjunctivitis, right eye: Secondary | ICD-10-CM | POA: Diagnosis not present

## 2023-07-04 MED ORDER — CLINDAMYCIN HCL 300 MG PO CAPS: 300.0000 mg | ORAL_CAPSULE | Freq: Three times a day (TID) | ORAL | 0 refills | Status: AC

## 2023-07-04 MED ORDER — ERYTHROMYCIN 5 MG/GM OP OINT: TOPICAL_OINTMENT | OPHTHALMIC | 0 refills | Status: AC

## 2023-07-04 MED ORDER — TETRACAINE HCL 0.5 % OP SOLN
OPHTHALMIC | Status: AC
  Filled 2023-07-04: qty 4

## 2023-07-04 MED ORDER — FLUORESCEIN SODIUM 1 MG OP STRP
ORAL_STRIP | OPHTHALMIC | Status: AC
  Filled 2023-07-04: qty 1

## 2023-07-04 NOTE — ED Triage Notes (Signed)
Pt reports that had mosquito spray himself when out at a party. Pt thinks when he wiped his wet rag to wipe face he got mosquito spray in right eye. Reports some drainage. And painful. Washed out last night little bit.

## 2023-07-04 NOTE — Discharge Instructions (Signed)
Take clindamycin antibiotic 3 times daily (with breakfast, lunch, and dinner) for the next 7 days.  Use erythromycin eye ointment 3 times daily for the next 7 days as well as prescribed by placing a small ribbon of ointment into the lower right eyelid.  After placing ointment into the lower right eyelid, close eyes and rolled your eyes to coat the eye with ointment.  You may take Tylenol/ibuprofen as needed for pain.  Please schedule an appointment with Dr. Essie Hart who is an eye doctor for the next 2 to 3 days.  If your symptoms do not improve in the next 2 to 3 days with antibiotics or if you develop vision loss, dizziness, or any changes in your vision, please go to the nearest emergency department for further workup and evaluation. Otherwise, do not rub your eyes.  I hope you feel better!

## 2023-07-04 NOTE — ED Provider Notes (Signed)
[facility]    CSN: [id number] Arrival date & time: 07/04/23  1113      History   Chief Complaint Chief Complaint  Patient presents with   Foreign Body in Eye    HPI Wayne Short is a 27 y.o. male.   Patient presents to urgent care for evaluation of right eye irritation, swelling, redness, and drainage that has worsened since initial onset last night.  Patient states he sprayed "cutter mosquito spray" onto his body and some of the mosquito spray got onto his wet rag that he used to wipe his right eye. He started experiencing burning sensation to the right eye after wiping eye with wash rag last night at around 9pm. He woke up this morning with copious white drainage from the right eye, swelling, redness, and mild tenderness to the soft tissues of the right eye. No vision changes or dizziness. He does not wear contacts or glasses for vision correction. No recent antibiotic/steroid use. Has not had any recent antibiotics. Has not attempted use of any medicines at home.  Poison control is concerned for deet chemical in   Poison control states the sprays can have deet 7%-40% and other things like ethanol and essential oils.  Deet if over 40% there have been reports of irritation and there is a risk for corneal injury They have some vision loss Treatment is for bacterial infection Improvement after rinsing  Eye exam to rule out injury Standard care for eye injury and follow-up with ophthalmology  Risk for corneal injury/vision loss related to deet if greater than 40% Most commonly conjunctivitis and irritation No vision loss If not already there wouldn't expect it right now      Past Medical History:  Diagnosis Date   Heart murmur     Patient Active Problem List   Diagnosis Date Noted   Fever 06/26/2021   Tonsillitis    Gram-positive bacteremia 06/25/2021   Hypokalemia 06/25/2021   Sepsis (HCC) 06/25/2021    Past Surgical History:  Procedure Laterality  Date   CARDIAC SURGERY         Home Medications    Prior to Admission medications   Medication Sig Start Date End Date Taking? Authorizing Provider  clindamycin (CLEOCIN) 300 MG capsule Take 1 capsule (300 mg total) by mouth 3 (three) times daily for 7 days. 07/04/23 07/11/23 Yes StanhopeDonavan Burnet, FNP  erythromycin ophthalmic ointment Place a 1/2 inch ribbon of ointment into the lower eyelid. 07/04/23  Yes Carlisle Beers, FNP  famotidine (PEPCID) 20 MG tablet Take 1 tablet (20 mg total) by mouth 2 (two) times daily. 06/29/21   Sharman Cheek, MD  metoCLOPramide (REGLAN) 10 MG tablet Take 1 tablet (10 mg total) by mouth every 6 (six) hours as needed. 06/29/21   Sharman Cheek, MD  ondansetron (ZOFRAN ODT) 4 MG disintegrating tablet Take 1 tablet (4 mg total) by mouth every 8 (eight) hours as needed for nausea or vomiting. 06/24/21   Concha Se, MD  polyethylene glycol powder (MIRALAX) powder Take 3 capfuls for day 1 and 2. On day 3 start taking 1 capful 3 times a day. Slowly cut back as needed until you have normal bowel movements 02/10/18   Cardama, Amadeo Garnet, MD  sucralfate (CARAFATE) 1 g tablet Take 1 tablet (1 g total) by mouth 4 (four) times daily -  with meals and at bedtime for 14 days. 09/04/18 09/18/18  Aviva Kluver B, PA-C  sucralfate (CARAFATE) 1 g  tablet Take 1 tablet (1 g total) by mouth 4 (four) times daily. 06/29/21   Sharman Cheek, MD    Family History No family history on file.  Social History Social History   Tobacco Use   Smoking status: Every Day    Types: E-cigarettes   Smokeless tobacco: Never  Vaping Use   Vaping Use: Never used  Substance Use Topics   Alcohol use: Not Currently   Drug use: Yes    Types: Marijuana    Comment: saturday last used     Allergies   Patient has no known allergies.   Review of Systems Review of Systems Per HPI  Physical Exam Triage Vital Signs ED Triage Vitals [07/04/23 1143]  Enc Vitals Group     BP  (!) 146/85     Pulse Rate (!) 111     Resp 15     Temp 99 F (37.2 C)     Temp Source Oral     SpO2 96 %     Weight      Height      Head Circumference      Peak Flow      Pain Score      Pain Loc      Pain Edu?      Excl. in GC?    No data found.  Updated Vital Signs BP (!) 146/85   Pulse (!) 111   Temp 99 F (37.2 C) (Oral)   Resp 15   SpO2 96%   Visual Acuity Right Eye Distance:   Left Eye Distance:   Bilateral Distance:    Right Eye Near:   Left Eye Near:    Bilateral Near:     Physical Exam Vitals and nursing note reviewed.  Constitutional:      Appearance: He is not ill-appearing or toxic-appearing.  HENT:     Head: Normocephalic and atraumatic.     Right Ear: Hearing and external ear normal.     Left Ear: Hearing and external ear normal.     Nose: Nose normal.     Mouth/Throat:     Lips: Pink.  Eyes:     General: Lids are normal. Lids are everted, no foreign bodies appreciated. Vision grossly intact. Gaze aligned appropriately. No visual field deficit or scleral icterus.       Right eye: No foreign body.     Extraocular Movements: Extraocular movements intact.     Right eye: Normal extraocular motion and no nystagmus.     Conjunctiva/sclera:     Right eye: Right conjunctiva is injected. Chemosis and exudate present.     Left eye: Left conjunctiva is not injected. No chemosis, exudate or hemorrhage.    Pupils: Pupils are equal, round, and reactive to light.     Comments: See image below.  Copious purulent drainage and preseptal erythema, tenderness, warmth, and swelling. Drainage and swelling improved significantly after eye wash. Vision grossly intact.   Cardiovascular:     Rate and Rhythm: Normal rate and regular rhythm.     Heart sounds: Normal heart sounds, S1 normal and S2 normal.  Pulmonary:     Effort: Pulmonary effort is normal. No respiratory distress.     Breath sounds: Normal breath sounds and air entry.  Musculoskeletal:     Cervical  back: Neck supple.  Skin:    General: Skin is warm and dry.     Capillary Refill: Capillary refill takes less than 2 seconds.  Findings: No rash.  Neurological:     General: No focal deficit present.     Mental Status: He is alert and oriented to person, place, and time. Mental status is at baseline.     Cranial Nerves: No dysarthria or facial asymmetry.  Psychiatric:        Mood and Affect: Mood normal.        Speech: Speech normal.        Behavior: Behavior normal.        Thought Content: Thought content normal.        Judgment: Judgment normal.        UC Treatments / Results  Labs (all labs ordered are listed, but only abnormal results are displayed) Labs Reviewed - No data to display  EKG   Radiology No results found.  Procedures Procedures (including critical care time)  Medications Ordered in UC Medications - No data to display  Initial Impression / Assessment and Plan / UC Course  I have reviewed the triage vital signs and the nursing notes.  Pertinent labs & imaging results that were available during my care of the patient were reviewed by me and considered in my medical decision making (see chart for details).   1. Preseptal cellulitis of right eye, mucopurulent conjunctivitis of right eye, chemical injury of right eye Eye thoroughly rinsed using eye wash machine in clinic with significant improvement in symptoms and exam after eye wash.   Poison control consulted and are concerned about concentration of "deet" chemical in type of mosquito spray patient used. If "deet" concentration exceeds 40%, there is risk for vision loss and corneal injury. Patient is not experiencing vision loss and symptoms improved significantly after eye was for 7 minutes. Poison control recommends typical treatment for conjunctivitis/preseptal cellulitis as patient will likely not develop worsening vision changes since this has not happened in the last 12 hours since  exposure.  Close follow-up in the next 2-3 days with Dr. Essie Hart optometry recommended, information given for patient to call to schedule appointment.  Preseptal cellulitis and mucopurulent conjunctivitis will be managed with clindamycin every 8 hours for the next 7 days and topical erythromycin every 8 hours for the next 7 days as well.  Warm compresses.   Discussed red flag signs and symptoms of worsening condition,when to call the PCP office, return to urgent care, and when to seek higher level of care in the emergency department. Counseled patient regarding appropriate use of medications and potential side effects for all medications recommended or prescribed today. Patient verbalizes understanding and agreement with plan. Discharged in stable condition.     Final Clinical Impressions(s) / UC Diagnoses   Final diagnoses:  Preseptal cellulitis of right eye  Mucopurulent conjunctivitis of right eye  Chemical injury of right eye, initial encounter     Discharge Instructions      Take clindamycin antibiotic 3 times daily (with breakfast, lunch, and dinner) for the next 7 days.  Use erythromycin eye ointment 3 times daily for the next 7 days as well as prescribed by placing a small ribbon of ointment into the lower right eyelid.  After placing ointment into the lower right eyelid, close eyes and rolled your eyes to coat the eye with ointment.  You may take Tylenol/ibuprofen as needed for pain.  Please schedule an appointment with Dr. Essie Hart who is an eye doctor for the next 2 to 3 days.  If your symptoms do not improve in the next 2 to 3 days  with antibiotics or if you develop vision loss, dizziness, or any changes in your vision, please go to the nearest emergency department for further workup and evaluation. Otherwise, do not rub your eyes.  I hope you feel better!     ED Prescriptions     Medication Sig Dispense Auth. Provider   clindamycin (CLEOCIN) 300 MG capsule Take 1  capsule (300 mg total) by mouth 3 (three) times daily for 7 days. 21 capsule Carlisle Beers, FNP   erythromycin ophthalmic ointment Place a 1/2 inch ribbon of ointment into the lower eyelid. 3.5 g Carlisle Beers, FNP      PDMP not reviewed this encounter.   Carlisle Beers, [place] 07/04/23 [id number]

## 2023-12-03 DIAGNOSIS — F1490 Cocaine use, unspecified, uncomplicated: Secondary | ICD-10-CM | POA: Diagnosis not present

## 2023-12-03 DIAGNOSIS — M25571 Pain in right ankle and joints of right foot: Secondary | ICD-10-CM | POA: Diagnosis not present

## 2023-12-03 DIAGNOSIS — F1590 Other stimulant use, unspecified, uncomplicated: Secondary | ICD-10-CM | POA: Diagnosis not present

## 2023-12-03 DIAGNOSIS — M7989 Other specified soft tissue disorders: Secondary | ICD-10-CM | POA: Diagnosis not present

## 2023-12-03 DIAGNOSIS — S99911A Unspecified injury of right ankle, initial encounter: Secondary | ICD-10-CM | POA: Diagnosis not present

## 2023-12-03 DIAGNOSIS — F17210 Nicotine dependence, cigarettes, uncomplicated: Secondary | ICD-10-CM | POA: Diagnosis not present

## 2023-12-03 DIAGNOSIS — F1290 Cannabis use, unspecified, uncomplicated: Secondary | ICD-10-CM | POA: Diagnosis not present

## 2023-12-03 DIAGNOSIS — W228XXA Striking against or struck by other objects, initial encounter: Secondary | ICD-10-CM | POA: Diagnosis not present

## 2023-12-03 DIAGNOSIS — S9031XA Contusion of right foot, initial encounter: Secondary | ICD-10-CM | POA: Diagnosis not present
